# Patient Record
Sex: Female | Born: 1995 | Race: Black or African American | Hispanic: No | Marital: Married | Smoking: Never smoker
Health system: Southern US, Community
[De-identification: ages and names within clinical notes are randomized; demographics above are authoritative.]

## PROBLEM LIST (undated history)

## (undated) DIAGNOSIS — U071 COVID-19: Secondary | ICD-10-CM

## (undated) DIAGNOSIS — R519 Headache, unspecified: Secondary | ICD-10-CM

## (undated) DIAGNOSIS — B009 Herpesviral infection, unspecified: Secondary | ICD-10-CM

## (undated) HISTORY — DX: Herpesviral infection, unspecified: B00.9

## (undated) HISTORY — DX: COVID-19: U07.1

## (undated) HISTORY — DX: Morbid (severe) obesity due to excess calories: E66.01

## (undated) HISTORY — DX: Headache, unspecified: R51.9

---
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
## 2018-11-06 ENCOUNTER — Telehealth: Payer: Self-pay

## 2018-11-06 ENCOUNTER — Ambulatory Visit: Payer: PRIVATE HEALTH INSURANCE | Admitting: Family Medicine

## 2018-11-06 NOTE — Telephone Encounter (Signed)
Questions for Screening COVID-19  Symptom onset:n/a  Travel or Contacts: no  During this illness, did/does the patient experience any of the following symptoms? Fever >100.4F []  Yes [x]  No []  Unknown Subjective fever (felt feverish) []  Yes [x]  No []  Unknown Chills []  Yes [x]  No []  Unknown Muscle aches (myalgia) []  Yes [x]  No []  Unknown Runny nose (rhinorrhea) []  Yes [x]  No []  Unknown Sore throat []  Yes [x]  No []  Unknown Cough (new onset or worsening of chronic cough) []  Yes [x]  No []  Unknown Shortness of breath (dyspnea) []  Yes [x]  No []  Unknown Nausea or vomiting []  Yes [x]  No []  Unknown Headache []  Yes [x]  No []  Unknown Abdominal pain  []  Yes [x]  No []  Unknown Diarrhea (?3 loose/looser than normal stools/24hr period) []  Yes [x]  No []  Unknown Other, specify:  Patient risk factors: Smoker? []  Current []  Former []  Never If female, currently pregnant? []  Yes []  No  There are no active problems to display for this patient.   Plan:  []  High risk for COVID-19 with red flags go to ED (with CP, SOB, weak/lightheaded, or fever > 101.5). Call ahead.  []  High risk for COVID-19 but stable. Inform provider and coordinate time for WEBEX visit.   []  No red flags but URI signs or symptoms okay for WEBEX visit.  

## 2018-11-07 ENCOUNTER — Ambulatory Visit: Payer: PRIVATE HEALTH INSURANCE | Admitting: Family Medicine

## 2018-11-14 ENCOUNTER — Ambulatory Visit (INDEPENDENT_AMBULATORY_CARE_PROVIDER_SITE_OTHER): Payer: PRIVATE HEALTH INSURANCE | Admitting: Family Medicine

## 2018-11-14 ENCOUNTER — Encounter: Payer: Self-pay | Admitting: Family Medicine

## 2018-11-14 VITALS — BP 124/84 | HR 101 | Temp 98.3°F | Ht 66.0 in | Wt 361.0 lb

## 2018-11-14 DIAGNOSIS — Z2821 Immunization not carried out because of patient refusal: Secondary | ICD-10-CM | POA: Diagnosis not present

## 2018-11-14 DIAGNOSIS — Z0000 Encounter for general adult medical examination without abnormal findings: Secondary | ICD-10-CM | POA: Diagnosis not present

## 2018-11-14 NOTE — Patient Instructions (Signed)
Preventive Care for [place], Female The transition to life after high school as a young adult can be a stressful time with many changes. You may start seeing a primary care physician instead of a pediatrician. This is the time when your health care becomes your responsibility. Preventive care refers to lifestyle choices and visits with your health care provider that can promote health and wellness. What does preventive care include?  A yearly physical exam. This is also called an annual wellness visit.  Dental exams once or twice a year.  Routine eye exams. Ask your health care provider how often you should have your eyes checked.  Personal lifestyle choices, including: ? Daily care of your teeth and gums. ? Regular physical activity. ? Eating a healthy diet. ? Avoiding tobacco and drug use. ? Avoiding or limiting alcohol use. ? Practicing safe sex. ? Taking vitamin and mineral supplements as recommended by your health care provider. What happens during an annual wellness visit? Preventive care starts with a yearly visit to your primary care physician. The services and screenings done by your health care provider during your annual wellness visit will depend on your overall health, lifestyle risk factors, and family history of disease. Counseling Your health care provider may ask you questions about:  Past medical problems and your family's medical history.  Medicines or supplements you take.  Health insurance and access to health care.  Alcohol, tobacco, and drug use.  Your safety at home, work, or school.  Access to firearms.  Emotional well-being and how you cope with stress.  Relationship well-being.  Diet, exercise, and sleep habits.  Your sexual health and activity.  Your methods of birth control.  Your menstrual cycle.  Your pregnancy history. Screening You may have the following tests or measurements:  Height, weight, and BMI.  Blood pressure.   Lipid and cholesterol levels.  Tuberculosis skin test.  Skin exam.  Vision and hearing tests.  Screening test for hepatitis.  Screening tests for sexually transmitted diseases (STDs), if you are at risk.  BRCA-related cancer screening. This may be done if you have a family history of breast, ovarian, tubal, or peritoneal cancers.  Pelvic exam and Pap test. This may be done every 3 years starting at age 2. Vaccines Your health care provider may recommend certain vaccines, such as:  Influenza vaccine. This is recommended every year.  Tetanus, diphtheria, and acellular pertussis (Tdap, Td) vaccine. You may need a Td booster every 10 years.  Varicella vaccine. You may need this if you have not been vaccinated.  HPV vaccine. If you are 69 or younger, you may need three doses over 6 months.  Measles, mumps, and rubella (MMR) vaccine. You may need at least one dose of MMR. You may also need a second dose.  Pneumococcal 13-valent conjugate (PCV13) vaccine. You may need this if you have certain conditions and were not previously vaccinated.  Pneumococcal polysaccharide (PPSV23) vaccine. You may need one or two doses if you smoke cigarettes or if you have certain conditions.  Meningococcal vaccine. One dose is recommended if you are age 29-21 years and a first-year college student living in a residence hall, or if you have one of several medical conditions. You may also need additional booster doses.  Hepatitis A vaccine. You may need this if you have certain conditions or if you travel or work in places where you may be exposed to hepatitis A.  Hepatitis B vaccine. You may need this if you have  certain conditions or if you travel or work in places where you may be exposed to hepatitis B.  Haemophilus influenzae type b (Hib) vaccine. You may need this if you have certain risk factors. Talk to your health care provider about which screenings and vaccines you need and how often you need  them. What steps can I take to develop healthy behaviors?      Have regular preventive health care visits with your primary care physician and dentist.  Eat a healthy diet.  Drink enough fluid to keep your urine pale yellow.  Stay active. Exercise at least 30 minutes 5 or more days of the week.  Use alcohol responsibly.  Maintain a healthy weight.  Do not use any products that contain nicotine, such as cigarettes, chewing tobacco, and e-cigarettes. If you need help quitting, ask your health care provider.  Do not use drugs.  Practice safe sex.  Use birth control (contraception) to prevent unwanted pregnancy. If you plan to become pregnant, see your health care provider for a pre-conception visit.  Find healthy ways to manage stress. How can I protect myself from injury? Injuries from violence or accidents are the leading cause of death among young adults and can often be prevented. Take these steps to help protect yourself:  Always wear your seat belt while driving or riding in a vehicle.  Do not drive if you have been drinking alcohol. Do not ride with someone who has been drinking.  Do not drive when you are tired or distracted. Do not text while driving.  Wear a helmet and other protective equipment during sports activities.  If you have firearms in your house, make sure you follow all gun safety procedures.  Seek help if you have been bullied, physically abused, or sexually abused.  Use the Internet responsibly to avoid dangers such as online bullying and online sexual predators. What can I do to cope with stress? Young adults may face many new challenges that can be stressful, such as finding a job, going to college, moving away from home, managing money, being in a relationship, getting married, and having children. To manage stress:  Avoid known stressful situations when you can.  Exercise regularly.  Find a stress-reducing activity that works best for you.  Examples include meditation, yoga, listening to music, or reading.  Spend time in nature.  Keep a journal to write about your stress and how you respond.  Talk to your health care provider about stress. He or she may suggest counseling.  Spend time with supportive friends or family.  Do not cope with stress by: ? Drinking alcohol or using drugs. ? Smoking cigarettes. ? Eating. Where can I get more information? Learn more about preventive care and healthy habits from:  [place] and Gynecologists: KaraokeExchange.nl  U.S. Probation officer Task Force: StageSync.si  National Adolescent and [place]: StrategicRoad.nl  American Academy of Pediatrics Bright Futures: https://brightfutures.MemberVerification.co.za  Society for Adolescent Health and Medicine: MoralBlog.co.za.aspx  PodExchange.nl: ToyLending.fr This information is not intended to replace advice given to you by your health care provider. Make sure you discuss any questions you have with your health care provider. Document Released: 09/22/2015 Document Revised: 12/18/2016 Document Reviewed: 09/22/2015 Elsevier Interactive Patient Education  2019 [employer].

## 2018-11-14 NOTE — Progress Notes (Signed)
Casey Rose is a 23 y.o. female  Chief Complaint  Patient presents with  . Establish Care    est care/CPE/ denies TDAP    HPI: Casey Rose is a 23 y.o. female here to establish care with our office and for CPE. She moved 10/20/18 to [place] [facility]. She has a boyfriend and they plan to get married this fall. She is currently looking for work.  She last saw previous PCP in 07/2018 - has labs at that time.  She has no issues or concerns today. She is overdue for vision exam and needs new Rx for glasses. She is due for dental exam.  Past Medical History:  Diagnosis Date  . HSV-1 infection     History reviewed. No pertinent surgical history.  Social History   Socioeconomic History  . Marital status: Single    Spouse name: Not on file  . Number of children: Not on file  . Years of education: Not on file  . Highest education level: Not on file  Occupational History  . Not on file  Social Needs  . Financial resource strain: Not on file  . Food insecurity    Worry: Not on file    Inability: Not on file  . Transportation needs    Medical: Not on file    Non-medical: Not on file  Tobacco Use  . Smoking status: Never Smoker  . Smokeless tobacco: Never Used  Substance and Sexual Activity  . Alcohol use: Never    Frequency: Never  . Drug use: Never  . Sexual activity: Not on file  Lifestyle  . Physical activity    Days per week: Not on file    Minutes per session: Not on file  . Stress: Not on file  Relationships  . Social Musicianconnections    Talks on phone: Not on file    Gets together: Not on file    Attends religious service: Not on file    Active member of club or organization: Not on file    Attends meetings of clubs or organizations: Not on file    Relationship status: Not on file  . Intimate partner violence    Fear of current or ex partner: Not on file    Emotionally abused: Not on file    Physically abused: Not on file    Forced sexual activity: Not on file   Other Topics Concern  . Not on file  Social History Narrative  . Not on file    Family History  Problem Relation Age of Onset  . Diabetes Mother   . Diabetes Maternal Aunt   . Diabetes Maternal Grandmother   . Diabetes Maternal Grandfather      There is no immunization history for the selected administration types on file for this patient.  No outpatient encounter medications on file as of 11/14/2018.   No facility-administered encounter medications on file as of 11/14/2018.      ROS: Gen: no fever, chills  Skin: no rash, itching ENT: no ear pain, ear drainage, nasal congestion, rhinorrhea, sinus pressure, sore throat Eyes: no blurry vision, double vision Resp: no cough, wheeze,SOB Breast: no breast tenderness, no nipple discharge, no breast masses CV: no CP, palpitations, LE edema,  GI: no heartburn, n/v/d/c, abd pain GU: no dysuria, urgency, frequency, hematuria; no vaginal itching, odor, discharge MSK: no joint pain, myalgias, back pain Neuro: no dizziness, headache, weakness, vertigo Psych: no depression, anxiety, insomnia   No Known Allergies  BP  124/84   Pulse (!) 101   Temp 98.3 F (36.8 C) (Oral)   Ht 5\' 6"  (1.676 m)   Wt (!) 361 lb (163.7 kg)   SpO2 99%   BMI 58.27 kg/m   Physical Exam  Constitutional: She is oriented to person, place, and time. She appears well-developed and well-nourished. No distress.  HENT:  Head: Normocephalic and atraumatic.  Right Ear: Tympanic membrane and ear canal normal.  Left Ear: Tympanic membrane and ear canal normal.  Nose: Nose normal.  Mouth/Throat: Oropharynx is clear and moist and mucous membranes are normal.  Eyes: Pupils are equal, round, and reactive to light. Conjunctivae are normal.  Neck: Neck supple. No thyromegaly present.  Cardiovascular: Normal rate, regular rhythm, normal heart sounds and intact distal pulses.  No murmur heard. Pulmonary/Chest: Effort normal and breath sounds normal. No respiratory  distress. She has no wheezes. She has no rhonchi.  Abdominal: Soft. Bowel sounds are normal. She exhibits no distension and no mass. There is no abdominal tenderness.  Musculoskeletal:        General: No edema.  Lymphadenopathy:    She has no cervical adenopathy.  Neurological: She is alert and oriented to person, place, and time. She exhibits normal muscle tone. Coordination normal.  Skin: Skin is warm and dry.  Psychiatric: She has a normal mood and affect. Her behavior is normal.  morbidly obese   A/P:  1. Annual physical exam - labs done in 07/2018 with previous PCP in [facility] - pt will request records - pt declines Tdap and states she believes immunizations UTD - pt due for dental and vision exams - pt is not conscious of her diet and does not exercise. She declines nutrition referral or referral to weight management - next CPE in 1 year  2. Tetanus, diphtheria, and acellular pertussis (Tdap) vaccination declined

## 2018-11-27 ENCOUNTER — Telehealth: Payer: Self-pay | Admitting: Family Medicine

## 2018-11-27 NOTE — Telephone Encounter (Signed)
Dr. Loletha Grayer okay to schedule TB skin test?

## 2018-11-27 NOTE — Telephone Encounter (Signed)
Yes ok to schedule visit for PPD placement

## 2018-11-27 NOTE — Telephone Encounter (Signed)
Patient called office requesting a TB skin test for a new job. Please contact patient if it is ok to schedule a nurse visit for TB skin test. There is no documentation giving ok for TB test. Please call patient.

## 2018-11-27 NOTE — Telephone Encounter (Signed)
Nurse visit made 

## 2018-12-01 ENCOUNTER — Telehealth: Payer: Self-pay | Admitting: Behavioral Health

## 2018-12-01 NOTE — Telephone Encounter (Signed)

## 2018-12-02 ENCOUNTER — Ambulatory Visit (INDEPENDENT_AMBULATORY_CARE_PROVIDER_SITE_OTHER): Payer: PRIVATE HEALTH INSURANCE

## 2018-12-02 DIAGNOSIS — Z111 Encounter for screening for respiratory tuberculosis: Secondary | ICD-10-CM

## 2018-12-02 NOTE — Progress Notes (Signed)
After obtaining consent, and per orders of Dr. Bryan Lemma, injection of Tubersol 0.20mL given in left forearm by Shawntell Dixson Berneta Sages. Patient instructed to remain in clinic for 20 minutes afterwards, and to report any adverse reaction to me immediately. She will return in 48 hours for this to be read/thx dmf

## 2018-12-03 NOTE — Progress Notes (Signed)
Agree with plan and treatment as documented below

## 2018-12-04 ENCOUNTER — Ambulatory Visit: Payer: PRIVATE HEALTH INSURANCE

## 2018-12-04 ENCOUNTER — Telehealth: Payer: Self-pay | Admitting: Family Medicine

## 2018-12-04 LAB — TB SKIN TEST: TB Skin Test: NEGATIVE

## 2018-12-04 NOTE — Telephone Encounter (Signed)

## 2019-09-23 ENCOUNTER — Other Ambulatory Visit: Payer: Self-pay

## 2019-09-24 ENCOUNTER — Telehealth (INDEPENDENT_AMBULATORY_CARE_PROVIDER_SITE_OTHER): Payer: Self-pay | Admitting: Family Medicine

## 2019-09-24 ENCOUNTER — Encounter: Payer: Self-pay | Admitting: Family Medicine

## 2019-09-24 ENCOUNTER — Telehealth: Payer: Self-pay

## 2019-09-24 VITALS — Temp 97.8°F | Ht 66.0 in

## 2019-09-24 DIAGNOSIS — R1030 Lower abdominal pain, unspecified: Secondary | ICD-10-CM

## 2019-09-24 DIAGNOSIS — N926 Irregular menstruation, unspecified: Secondary | ICD-10-CM

## 2019-09-24 NOTE — Telephone Encounter (Signed)
Called pt to schedule a lab appointment.  I LDM for her to call office to get scheduled.

## 2019-09-24 NOTE — Addendum Note (Signed)
Addended by: Varney Biles on: 09/24/2019 04:24 PM   Modules accepted: Orders

## 2019-09-24 NOTE — Progress Notes (Signed)
Virtual Visit via Video Note  Interactive audio and video telecommunications were attempted between myself and the patient, however failed, due to the patient having technical difficulties. We continued and completed the visit with audio only.   I connected with Casey Rose on 09/24/19 at  3:30 PM EDT by a video enabled telemedicine application and verified that I am speaking with the correct person using two identifiers. Location patient: home Location provider: work Persons participating in the virtual visit: patient, provider  I discussed the limitations of evaluation and management by telemedicine and the availability of in person appointments. The patient expressed understanding and agreed to proceed.  Chief Complaint  Patient presents with  . Abdominal Pain    Pt c/o having sharpe abd pain on and off since her last period in February.  Pt said that she has not had a period since 07/17/19 and that is not normal for her.  Pt has taken 2 pregnancy test and both came back negative.     HPI: Casey Rose is a 24 y.o. female who states she last period was in 06/2019 and it was normal length and flow.  She has had 4 episodes sharp pain in her lower abdomen (pt unable to further localize) since 06/2019 lasting less than a minute then resolves. No associated symptoms. Pain can be at rest (watching TV) or when she is standing HPT negative x 2 - last one on 09/19/19.   370ish lbs last week.  Wt Readings from Last 3 Encounters:  11/14/18 (!) 361 lb (163.7 kg)  No new or increased stress.   No GI issues - no constipation, diarrhea; appetite is good but less than normal; no reflux   Past Medical History:  Diagnosis Date  . HSV-1 infection     History reviewed. No pertinent surgical history.  Family History  Problem Relation Age of Onset  . Diabetes Mother   . Diabetes Maternal Aunt   . Diabetes Maternal Grandmother   . Diabetes Maternal Grandfather     Social History    Tobacco Use  . Smoking status: Never Smoker  . Smokeless tobacco: Never Used  Substance Use Topics  . Alcohol use: Never  . Drug use: Never    No current outpatient medications on file.  No Known Allergies    ROS: See pertinent positives and negatives per HPI.   EXAM:  VITALS per patient if applicable: Temp 97.8 F (36.6 C) (Temporal)   Ht 5\' 6"  (1.676 m)   LMP 07/17/2019   BMI 58.27 kg/m    GENERAL: alert, oriented, in no acute distress   LUNGS: no obvious SOB, gasping or wheezing, no conversational dyspnea  PSYCH/NEURO: pleasant and cooperative,  speech and thought processing grossly intact   ASSESSMENT AND PLAN: 1. Irregular periods 2. 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 Lower abdominal pain - 2 neg urine pregnancy tests, most recent 5 days ago - 07/19/2019 Pelvic Complete With Transvaginal; Future - Hemoglobin A1c - Testosterone - TSH   I discussed the assessment and treatment plan with the patient. The patient was provided an opportunity to ask questions and all were answered. The patient agreed with the plan and demonstrated an understanding of the instructions.   The patient was advised to call back or seek an in-person evaluation if the symptoms worsen or if the condition fails to improve as anticipated.   I spent [place] on the telephone appt with this patient.  , DO

## 2019-09-28 ENCOUNTER — Other Ambulatory Visit: Payer: Self-pay

## 2019-09-29 ENCOUNTER — Other Ambulatory Visit (INDEPENDENT_AMBULATORY_CARE_PROVIDER_SITE_OTHER): Payer: Self-pay

## 2019-09-29 DIAGNOSIS — N926 Irregular menstruation, unspecified: Secondary | ICD-10-CM

## 2019-09-30 LAB — TSH: TSH: 1.91 u[IU]/mL (ref 0.35–4.50)

## 2019-09-30 LAB — HEMOGLOBIN A1C: Hgb A1c MFr Bld: 5.7 % (ref 4.6–6.5)

## 2019-09-30 LAB — TESTOSTERONE: Testosterone: 45.64 ng/dL — ABNORMAL HIGH (ref 15.00–40.00)

## 2019-10-01 ENCOUNTER — Encounter: Payer: Self-pay | Admitting: Family Medicine

## 2019-10-07 ENCOUNTER — Other Ambulatory Visit: Payer: Self-pay

## 2019-10-23 ENCOUNTER — Ambulatory Visit
Admission: RE | Admit: 2019-10-23 | Discharge: 2019-10-23 | Disposition: A | Discharge status: Home / Self Care | Payer: 59 | Source: Ambulatory Visit | Attending: Family Medicine | Admitting: Family Medicine

## 2019-10-23 DIAGNOSIS — N926 Irregular menstruation, unspecified: Secondary | ICD-10-CM

## 2019-10-26 ENCOUNTER — Telehealth: Payer: Self-pay | Admitting: Family Medicine

## 2019-10-26 NOTE — Telephone Encounter (Signed)
Patient is calling to see if ultra sound results were back. CB is [phone]

## 2019-10-27 NOTE — Telephone Encounter (Signed)
Informed pt that Dr. Salena Saner will be in office tomorrow to result U/S and let pt know.

## 2019-10-28 NOTE — Telephone Encounter (Signed)
Notified patient of US results. Patient verbalized understanding.  

## 2019-10-28 NOTE — Telephone Encounter (Signed)
Pelvic US was normal other than a small amount of free fluid which could represent a recent ruptured cyst. This is benign and will be resorbed by the body. If she is still having this ongoing pain, would recommend she see OB-GYN

## 2020-04-21 LAB — OB RESULTS CONSOLE HEPATITIS B SURFACE ANTIGEN: Hepatitis B Surface Ag: NEGATIVE

## 2020-04-21 LAB — OB RESULTS CONSOLE RUBELLA ANTIBODY, IGM: Rubella: IMMUNE

## 2020-04-21 LAB — OB RESULTS CONSOLE GC/CHLAMYDIA
Chlamydia: NEGATIVE
Gonorrhea: NEGATIVE

## 2020-04-21 LAB — OB RESULTS CONSOLE HIV ANTIBODY (ROUTINE TESTING): HIV: NONREACTIVE

## 2020-04-28 ENCOUNTER — Other Ambulatory Visit: Payer: BC Managed Care – PPO

## 2020-04-28 DIAGNOSIS — Z20822 Contact with and (suspected) exposure to covid-19: Secondary | ICD-10-CM

## 2020-04-29 ENCOUNTER — Telehealth: Payer: Self-pay

## 2020-04-29 LAB — NOVEL CORONAVIRUS, NAA: SARS-CoV-2, NAA: DETECTED — AB

## 2020-04-29 LAB — SARS-COV-2, NAA 2 DAY TAT

## 2020-04-29 NOTE — Telephone Encounter (Signed)
Pt. Calling to verify positive COVID 19 results. Verbalizes understanding.

## 2020-04-30 ENCOUNTER — Encounter: Payer: Self-pay | Admitting: Oncology

## 2020-04-30 ENCOUNTER — Telehealth: Payer: Self-pay | Admitting: Oncology

## 2020-04-30 NOTE — Telephone Encounter (Signed)
Re: Mab Infusion  Called to Discuss with patient about Covid symptoms and the use of regeneron, a monoclonal antibody infusion for those with mild to moderate Covid symptoms and at a high risk of hospitalization.     Pt is qualified for this infusion at the [facility] center due to co-morbid conditions and/or a member of an at-risk group.    Past Medical History:  Diagnosis Date  . HSV-1 infection     Specific risk condition-obesity    Unable to reach pt. Left VM and MCM.  Mignon Pine, AGNP-C [phone] (Infusion Center Hotline) ,

## 2020-05-05 ENCOUNTER — Other Ambulatory Visit: Payer: BC Managed Care – PPO

## 2020-05-05 DIAGNOSIS — Z20822 Contact with and (suspected) exposure to covid-19: Secondary | ICD-10-CM

## 2020-05-07 LAB — NOVEL CORONAVIRUS, NAA: SARS-CoV-2, NAA: NOT DETECTED

## 2020-05-07 LAB — SARS-COV-2, NAA 2 DAY TAT

## 2020-05-21 NOTE — L&D Delivery Note (Signed)
Patient was C/C/+1 and pushed for about 10 minutes with epidural when she began to have moderate to severe variables with each contraction.  Consented pt for VE delivery and VE applied 4 times with 2 popoffs. Last pull to head delivery.  Shoulder dystocia resolved with [place], [place] screw and suprapubic pressure; finally relieved with episiotomy and delivery of posterior arm within 45 seconds.  VD  female infant, Apgars 2,8, weight P.  Cord gas still pending.  Total pushing time about 20 minutes.  The patient had no extension lacerations to the second degree episiotomy midline perineal- repaired with 2-0 vicryl R. Fundus was firm. EBL was expected amount. Placenta was delivered intact. Vagina was clear.  Delayed cord clamping WAS NOT done as baby was handed directly to Neo. Baby after stimulation rescucitation was vigorous and doing skin to skin with mother.  Casey Rose

## 2020-06-07 ENCOUNTER — Other Ambulatory Visit: Payer: Self-pay | Admitting: Obstetrics & Gynecology

## 2020-06-07 DIAGNOSIS — Z6841 Body Mass Index (BMI) 40.0 and over, adult: Secondary | ICD-10-CM

## 2020-06-16 ENCOUNTER — Encounter: Payer: Self-pay | Admitting: *Deleted

## 2020-06-20 ENCOUNTER — Ambulatory Visit: Payer: BC Managed Care – PPO | Admitting: *Deleted

## 2020-06-20 ENCOUNTER — Ambulatory Visit: Payer: BC Managed Care – PPO | Attending: Obstetrics & Gynecology

## 2020-06-20 ENCOUNTER — Encounter: Payer: Self-pay | Admitting: *Deleted

## 2020-06-20 ENCOUNTER — Other Ambulatory Visit: Payer: Self-pay

## 2020-06-20 ENCOUNTER — Other Ambulatory Visit: Payer: Self-pay | Admitting: *Deleted

## 2020-06-20 DIAGNOSIS — O99212 Obesity complicating pregnancy, second trimester: Secondary | ICD-10-CM

## 2020-06-20 DIAGNOSIS — O99332 Smoking (tobacco) complicating pregnancy, second trimester: Secondary | ICD-10-CM

## 2020-06-20 DIAGNOSIS — Z8616 Personal history of COVID-19: Secondary | ICD-10-CM

## 2020-06-20 DIAGNOSIS — R638 Other symptoms and signs concerning food and fluid intake: Secondary | ICD-10-CM

## 2020-06-20 DIAGNOSIS — Z3A19 19 weeks gestation of pregnancy: Secondary | ICD-10-CM

## 2020-06-20 DIAGNOSIS — O360120 Maternal care for anti-D [Rh] antibodies, second trimester, not applicable or unspecified: Secondary | ICD-10-CM

## 2020-06-20 DIAGNOSIS — O2692 Pregnancy related conditions, unspecified, second trimester: Secondary | ICD-10-CM

## 2020-06-20 DIAGNOSIS — Z6841 Body Mass Index (BMI) 40.0 and over, adult: Secondary | ICD-10-CM | POA: Diagnosis present

## 2020-06-20 DIAGNOSIS — E669 Obesity, unspecified: Secondary | ICD-10-CM

## 2020-06-20 DIAGNOSIS — F17200 Nicotine dependence, unspecified, uncomplicated: Secondary | ICD-10-CM

## 2020-07-19 ENCOUNTER — Encounter: Payer: Self-pay | Admitting: *Deleted

## 2020-07-19 ENCOUNTER — Ambulatory Visit: Payer: BC Managed Care – PPO | Admitting: *Deleted

## 2020-07-19 ENCOUNTER — Other Ambulatory Visit: Payer: Self-pay

## 2020-07-19 ENCOUNTER — Ambulatory Visit: Payer: BC Managed Care – PPO | Attending: Obstetrics

## 2020-07-19 VITALS — BP 138/78 | HR 95

## 2020-07-19 DIAGNOSIS — Z6841 Body Mass Index (BMI) 40.0 and over, adult: Secondary | ICD-10-CM | POA: Diagnosis present

## 2020-07-19 DIAGNOSIS — Z3A23 23 weeks gestation of pregnancy: Secondary | ICD-10-CM

## 2020-07-19 DIAGNOSIS — F17200 Nicotine dependence, unspecified, uncomplicated: Secondary | ICD-10-CM

## 2020-07-19 DIAGNOSIS — R638 Other symptoms and signs concerning food and fluid intake: Secondary | ICD-10-CM | POA: Insufficient documentation

## 2020-07-19 DIAGNOSIS — E669 Obesity, unspecified: Secondary | ICD-10-CM

## 2020-07-19 DIAGNOSIS — O98512 Other viral diseases complicating pregnancy, second trimester: Secondary | ICD-10-CM | POA: Diagnosis not present

## 2020-07-19 DIAGNOSIS — Z8616 Personal history of COVID-19: Secondary | ICD-10-CM

## 2020-07-19 DIAGNOSIS — O99332 Smoking (tobacco) complicating pregnancy, second trimester: Secondary | ICD-10-CM

## 2020-07-19 DIAGNOSIS — O99212 Obesity complicating pregnancy, second trimester: Secondary | ICD-10-CM

## 2020-07-19 DIAGNOSIS — O360120 Maternal care for anti-D [Rh] antibodies, second trimester, not applicable or unspecified: Secondary | ICD-10-CM

## 2020-10-20 LAB — OB RESULTS CONSOLE GBS: GBS: POSITIVE

## 2020-10-26 ENCOUNTER — Inpatient Hospital Stay (HOSPITAL_BASED_OUTPATIENT_CLINIC_OR_DEPARTMENT_OTHER): Payer: BC Managed Care – PPO

## 2020-10-26 ENCOUNTER — Encounter (HOSPITAL_COMMUNITY): Payer: Self-pay | Admitting: Obstetrics and Gynecology

## 2020-10-26 ENCOUNTER — Other Ambulatory Visit: Payer: Self-pay

## 2020-10-26 ENCOUNTER — Inpatient Hospital Stay (EMERGENCY_DEPARTMENT_HOSPITAL)
Admission: AD | Admit: 2020-10-26 | Discharge: 2020-10-26 | Disposition: A | Discharge status: Home / Self Care | Payer: BC Managed Care – PPO | Source: Home / Self Care | Attending: Obstetrics and Gynecology | Admitting: Obstetrics and Gynecology

## 2020-10-26 DIAGNOSIS — Z8616 Personal history of COVID-19: Secondary | ICD-10-CM | POA: Insufficient documentation

## 2020-10-26 DIAGNOSIS — O26893 Other specified pregnancy related conditions, third trimester: Secondary | ICD-10-CM

## 2020-10-26 DIAGNOSIS — Z3A37 37 weeks gestation of pregnancy: Secondary | ICD-10-CM

## 2020-10-26 DIAGNOSIS — O368330 Maternal care for abnormalities of the fetal heart rate or rhythm, third trimester, not applicable or unspecified: Secondary | ICD-10-CM | POA: Insufficient documentation

## 2020-10-26 DIAGNOSIS — O288 Other abnormal findings on antenatal screening of mother: Secondary | ICD-10-CM

## 2020-10-26 DIAGNOSIS — Z3689 Encounter for other specified antenatal screening: Secondary | ICD-10-CM

## 2020-10-26 DIAGNOSIS — O99213 Obesity complicating pregnancy, third trimester: Secondary | ICD-10-CM | POA: Insufficient documentation

## 2020-10-26 DIAGNOSIS — R030 Elevated blood-pressure reading, without diagnosis of hypertension: Secondary | ICD-10-CM

## 2020-10-26 LAB — CBC
HCT: 37.9 % (ref 36.0–46.0)
Hemoglobin: 12.7 g/dL (ref 12.0–15.0)
MCH: 30.5 pg (ref 26.0–34.0)
MCHC: 33.5 g/dL (ref 30.0–36.0)
MCV: 90.9 fL (ref 80.0–100.0)
Platelets: 195 10*3/uL (ref 150–400)
RBC: 4.17 MIL/uL (ref 3.87–5.11)
RDW: 14.7 % (ref 11.5–15.5)
WBC: 10.1 10*3/uL (ref 4.0–10.5)
nRBC: 0 % (ref 0.0–0.2)

## 2020-10-26 LAB — URINALYSIS, ROUTINE W REFLEX MICROSCOPIC
Bilirubin Urine: NEGATIVE
Glucose, UA: NEGATIVE mg/dL
Hgb urine dipstick: NEGATIVE
Ketones, ur: NEGATIVE mg/dL
Leukocytes,Ua: NEGATIVE
Nitrite: NEGATIVE
Protein, ur: 30 mg/dL — AB
Specific Gravity, Urine: 1.027 (ref 1.005–1.030)
pH: 6 (ref 5.0–8.0)

## 2020-10-26 LAB — COMPREHENSIVE METABOLIC PANEL
ALT: 18 U/L (ref 0–44)
AST: 23 U/L (ref 15–41)
Albumin: 2.5 g/dL — ABNORMAL LOW (ref 3.5–5.0)
Alkaline Phosphatase: 105 U/L (ref 38–126)
Anion gap: 9 (ref 5–15)
BUN: 7 mg/dL (ref 6–20)
CO2: 21 mmol/L — ABNORMAL LOW (ref 22–32)
Calcium: 9.1 mg/dL (ref 8.9–10.3)
Chloride: 106 mmol/L (ref 98–111)
Creatinine, Ser: 0.76 mg/dL (ref 0.44–1.00)
GFR, Estimated: >60 mL/min (ref >60)
Glucose, Bld: 95 mg/dL (ref 70–99)
Potassium: 4 mmol/L (ref 3.5–5.1)
Sodium: 136 mmol/L (ref 135–145)
Total Bilirubin: 0.3 mg/dL (ref 0.3–1.2)
Total Protein: 5.9 g/dL — ABNORMAL LOW (ref 6.5–8.1)

## 2020-10-26 LAB — PROTEIN / CREATININE RATIO, URINE
Creatinine, Urine: 309.08 mg/dL
Protein Creatinine Ratio: 0.08 mg/mg{Cre} (ref 0.00–0.15)
Total Protein, Urine: 26 mg/dL

## 2020-10-26 NOTE — Discharge Instructions (Signed)
Fetal Movement Counts Patient Name: ________________________________________________ Patient Due Date: ____________________  What is a fetal movement count? A fetal movement count is the number of times that you feel your baby move during a certain amount of time. This may also be called a fetal kick count. A fetal movement count is recommended for every pregnant woman. You may be asked to start counting fetal movements as early as week 28 of your pregnancy. Pay attention to when your baby is most active. You may notice your baby's sleep and wake cycles. You may also notice things that make your baby move more. You should do a fetal movement count:  When your baby is normally most active.  At the same time each day. A good time to count movements is while you are resting, after having something to eat and drink. How do I count fetal movements? 1. Find a quiet, comfortable area. Sit, or lie down on your side. 2. Write down the date, the start time and stop time, and the number of movements that you felt between those two times. Take this information with you to your health care visits. 3. Write down your start time when you feel the first movement. 4. Count kicks, flutters, swishes, rolls, and jabs. You should feel at least 10 movements. 5. You may stop counting after you have felt 10 movements, or if you have been counting for 2 hours. Write down the stop time. 6. If you do not feel 10 movements in 2 hours, contact your health care provider for further instructions. Your health care provider may want to do additional tests to assess your baby's well-being. Contact a health care provider if:  You feel fewer than 10 movements in 2 hours.  Your baby is not moving like he or she usually does. Date: ____________ Start time: ____________ Stop time: ____________ Movements: ____________ Date: ____________ Start time: ____________ Stop time: ____________ Movements: ____________ Date: ____________  Start time: ____________ Stop time: ____________ Movements: ____________ Date: ____________ Start time: ____________ Stop time: ____________ Movements: ____________ Date: ____________ Start time: ____________ Stop time: ____________ Movements: ____________ Date: ____________ Start time: ____________ Stop time: ____________ Movements: ____________ Date: ____________ Start time: ____________ Stop time: ____________ Movements: ____________ Date: ____________ Start time: ____________ Stop time: ____________ Movements: ____________ Date: ____________ Start time: ____________ Stop time: ____________ Movements: ____________ This information is not intended to replace advice given to you by your health care provider. Make sure you discuss any questions you have with your health care provider. Document Revised: 12/25/2018 Document Reviewed: 12/25/2018 Elsevier Patient Education  2021 Elsevier Inc. Hypertension During Pregnancy High blood pressure (hypertension) is when the force of blood pumping through the arteries is high enough to cause problems with your health. Arteries are blood vessels that carry blood from the heart throughout the body. Hypertension during pregnancy can cause problems for you and your baby. It can be mild or severe. There are different types of hypertension that can happen during pregnancy. These include:  Chronic hypertension. This happens when you had high blood pressure before you became pregnant, and it continues during the pregnancy. Hypertension that develops before you are [redacted] weeks pregnant and continues during the pregnancy is also called chronic hypertension. If you have chronic hypertension, it will not go away after you have your baby. You will need follow-up visits with your health care provider after you have your baby. Your health care provider may want you to keep taking medicine for your blood pressure.  Gestational hypertension. This   is hypertension that develops  after the 20th week of pregnancy. Gestational hypertension usually goes away after you have your baby, but your health care provider will need to monitor your blood pressure to make sure that it is getting better.  Postpartum hypertension. This is high blood pressure that was present before delivery and continues after delivery or that starts after delivery. This usually occurs within 48 hours after childbirth but may occur up to 6 weeks after giving birth. When hypertension during pregnancy is severe, it is a medical emergency that requires treatment right away. How does this affect me? Women who have hypertension during pregnancy have a greater chance of developing hypertension later in life or during future pregnancies. In some cases, hypertension during pregnancy can cause serious complications, such as:  Stroke.  Heart attack.  Injury to other organs, such as kidneys, lungs, or liver.  Preeclampsia.  A condition called hemolysis, elevated liver enzymes, and low platelet count (HELLP) syndrome.  Convulsions or seizures.  Placental abruption. How does this affect my baby? Hypertension during pregnancy can affect your baby. Your baby may:  Be born early (prematurely).  Not weigh as much as he or she should at birth (low birth weight).  Not tolerate labor well, leading to an unplanned cesarean delivery. This condition may also result in a baby's death before birth (stillbirth). What are the risks? There are certain factors that make it more likely for you to develop hypertension during pregnancy. These include:  Having hypertension during a previous pregnancy or a family history of hypertension.  Being overweight.  Being age 35 or older.  Being pregnant for the first time.  Being pregnant with more than one baby.  Becoming pregnant using fertilization methods, such as IVF (in vitro fertilization).  Having other medical problems, such as diabetes, kidney disease, or  lupus. What can I do to lower my risk? The exact cause of hypertension during pregnancy is not known. You may be able to lower your risk by:  Maintaining a healthy weight.  Eating a healthy and balanced diet.  Following your health care provider's instructions about treating any long-term conditions that you had before becoming pregnant. It is very important to keep all of your prenatal care appointments. Your health care provider will check your blood pressure and make sure that your pregnancy is progressing as expected. If a problem is found, early treatment can prevent complications.   How is this treated? Treatment for hypertension during pregnancy varies depending on the type of hypertension you have and how serious it is.  If you were taking medicine for high blood pressure before you became pregnant, talk with your health care provider. You may need to change medicine during pregnancy because some medicines, like ACE inhibitors, may not be considered safe for your baby.  If you have gestational hypertension, your health care provider may order medicine to treat this during pregnancy.  If you are at risk for preeclampsia, your health care provider may recommend that you take a low-dose aspirin during your pregnancy.  If you have severe hypertension, you may need to be hospitalized so you and your baby can be monitored closely. You may also need to be given medicine to lower your blood pressure.  In some cases, if your condition gets worse, you may need to deliver your baby early. Follow these instructions at home: Eating and drinking  Drink enough fluid to keep your urine pale yellow.  Avoid caffeine.   Lifestyle  Do not   use any products that contain nicotine or tobacco. These products include cigarettes, chewing tobacco, and vaping devices, such as e-cigarettes. If you need help quitting, ask your health care provider.  Do not use alcohol or drugs.  Avoid stress as much as  possible.  Rest and get plenty of sleep.  Regular exercise can help to reduce your blood pressure. Ask your health care provider what kinds of exercise are best for you. General instructions  Take over-the-counter and prescription medicines only as told by your health care provider.  Keep all prenatal and follow-up visits. This is important. Contact a health care provider if:  You have symptoms that your health care provider told you may require more treatment or monitoring, such as: ? Headaches. ? Nausea or vomiting. ? Abdominal pain. ? Dizziness. ? Light-headedness. Get help right away if:  You have symptoms of serious complications, such as: ? Severe abdominal pain that does not get better with treatment. ? A severe headache that does not get better, blurred vision, or double vision. ? Vomiting that does not get better. ? Sudden, rapid weight gain or swelling in your hands, ankles, or face. ? Vaginal bleeding. ? Blood in your urine. ? Shortness of breath or chest pain. ? Weakness on one side of your body or difficulty speaking.  Your baby is not moving as much as usual. These symptoms may represent a serious problem that is an emergency. Do not wait to see if the symptoms will go away. Get medical help right away. Call your local emergency services (911 in the [place] Do not drive yourself to the hospital. Summary  Hypertension during pregnancy can cause problems for you and your baby.  Treatment for hypertension during pregnancy varies depending on the type of hypertension you have and how serious it is.  Keep all prenatal and follow-up visits. This is important.  Get help right away if you have symptoms of serious complications related to high blood pressure. This information is not intended to replace advice given to you by your health care provider. Make sure you discuss any questions you have with your health care provider. Document Revised: 01/28/2020 Document  Reviewed: 01/28/2020 Elsevier Patient Education  2021 Elsevier Inc.  

## 2020-10-26 NOTE — MAU Note (Signed)
Casey Rose is a 25 y.o. at [redacted]w[redacted]d here in MAU reporting: sent over from the office for failed NST. Reporting +FM. Denies bleeding, LOF, or pain.  Onset of complaint: today  Pain score: 0/10  Vitals:   10/26/20 1148 10/26/20 1205  BP: (!) 148/89 (!) 144/93  Pulse: 91 83  Resp: 18   Temp:  98.1 F (36.7 C)  SpO2: 99%      FHT: EFM applied in room  Lab orders placed from triage: UA

## 2020-10-26 NOTE — MAU Provider Note (Signed)
History     CSN: [id number]  Arrival date and time: 10/26/20 1133   Event Date/Time   First Provider Initiated Contact with Patient 10/26/20 1232      Chief Complaint  Patient presents with  . Fetal monitoring   HPI Casey Rose is a 25 y.o. G1P0 at [redacted]w[redacted]d who presents from the office for a BPP. Was seen at [facility] ob by Dr. Henderson Cloud; had an NST which showed one acceleration. Sent here for a BPP. Denies history of hypertension. Denies headache, visual disturbance, epigastric pain, contractions, LOF, or vaginal bleeding. Reports good fetal movement.   OB History    Gravida  1   Para      Term      Preterm      AB      Living        SAB      IAB      Ectopic      Multiple      Live Births              Past Medical History:  Diagnosis Date  . COVID-19   . HSV-1 infection   . Morbid obesity (HCC)     Past Surgical History:  Procedure Laterality Date  . WISDOM TOOTH EXTRACTION  2017    Family History  Problem Relation Age of Onset  . Diabetes Mother   . Diabetes Maternal Aunt   . Diabetes Maternal Grandmother   . Diabetes Maternal Grandfather     Social History   Tobacco Use  . Smoking status: Never Smoker  . Smokeless tobacco: Never Used  Vaping Use  . Vaping Use: Never used  Substance Use Topics  . Alcohol use: Never  . Drug use: Never    Allergies: No Known Allergies  Medications Prior to Admission  Medication Sig Dispense Refill Last Dose  . aspirin EC 81 MG tablet Take 81 mg by mouth daily. Swallow whole.     . Prenatal Vit-Fe Fumarate-FA (PRENATAL MULTIVITAMIN) TABS tablet Take 1 tablet by mouth daily at 12 noon.     . valACYclovir (VALTREX) 500 MG tablet Take 500 mg by mouth 2 (two) times daily.       Review of Systems  Constitutional: Negative.   Eyes: Negative for visual disturbance.  Gastrointestinal: Negative.   Genitourinary: Negative.   Neurological: Negative for headaches.   Physical Exam   Blood pressure  138/90, pulse 93, temperature 98.1 F (36.7 C), temperature source Oral, resp. rate 18, last menstrual period 01/25/2020, SpO2 99 %. Patient Vitals for the past 24 hrs:  BP Temp Temp src Pulse Resp SpO2  10/26/20 1331 (!) 135/93 -- -- 84 -- --  10/26/20 1317 (!) 143/93 -- -- (!) 101 -- --  10/26/20 1246 (!) 143/84 -- -- 90 -- --  10/26/20 1231 137/84 -- -- 91 -- --  10/26/20 1216 138/90 -- -- 93 -- --  10/26/20 1205 (!) 144/93 98.1 F (36.7 C) Oral 83 -- --  10/26/20 1148 (!) 148/89 -- -- 91 18 99 %    Physical Exam Vitals and nursing note reviewed.  Constitutional:      General: She is not in acute distress.    Appearance: Normal appearance. She is obese.  HENT:     Head: Normocephalic and atraumatic.  Eyes:     General: No scleral icterus. Pulmonary:     Effort: Pulmonary effort is normal. No respiratory distress.  Abdominal:     Tenderness: There  is no abdominal tenderness.  Musculoskeletal:     Right lower leg: 2+ Pitting Edema present.     Left lower leg: 2+ Pitting Edema present.  Skin:    General: Skin is warm and dry.  Neurological:     Mental Status: She is alert.  Psychiatric:        Mood and Affect: Mood normal.        Behavior: Behavior normal.    NST:  Baseline: 145 bpm, Variability: Good {> 6 bpm), Accelerations: Reactive and Decelerations: Absent  MAU Course  Procedures Results for orders placed or performed during the hospital encounter of 10/26/20 (from the past 24 hour(s))  Urinalysis, Routine w reflex microscopic Urine, Clean Catch     Status: Abnormal   Collection Time: 10/26/20 12:07 PM  Result Value Ref Range   Color, Urine YELLOW YELLOW   APPearance HAZY (A) CLEAR   Specific Gravity, Urine 1.027 1.005 - 1.030   pH 6.0 5.0 - 8.0   Glucose, UA NEGATIVE NEGATIVE mg/dL   Hgb urine dipstick NEGATIVE NEGATIVE   Bilirubin Urine NEGATIVE NEGATIVE   Ketones, ur NEGATIVE NEGATIVE mg/dL   Protein, ur 30 (A) NEGATIVE mg/dL   Nitrite NEGATIVE NEGATIVE    Leukocytes,Ua NEGATIVE NEGATIVE   RBC / HPF 0-5 0 - 5 RBC/hpf   WBC, UA 0-5 0 - 5 WBC/hpf   Bacteria, UA FEW (A) NONE SEEN   Squamous Epithelial / LPF 6-10 0 - 5   Mucus PRESENT   Protein / creatinine ratio, urine     Status: None   Collection Time: 10/26/20 12:12 PM  Result Value Ref Range   Creatinine, Urine 309.08 mg/dL   Total Protein, Urine 26 mg/dL   Protein Creatinine Ratio 0.08 0.00 - 0.15 mg/mg[Cre]  CBC     Status: None   Collection Time: 10/26/20 12:23 PM  Result Value Ref Range   WBC 10.1 4.0 - 10.5 K/uL   RBC 4.17 3.87 - 5.11 MIL/uL   Hemoglobin 12.7 12.0 - 15.0 g/dL   HCT 88.4 16.6 - 06.3 %   MCV 90.9 80.0 - 100.0 fL   MCH 30.5 26.0 - 34.0 pg   MCHC 33.5 30.0 - 36.0 g/dL   RDW 01.6 01.0 - 93.2 %   Platelets 195 150 - 400 K/uL   nRBC 0.0 0.0 - 0.2 %  Comprehensive metabolic panel     Status: Abnormal   Collection Time: 10/26/20 12:23 PM  Result Value Ref Range   Sodium 136 135 - 145 mmol/L   Potassium 4.0 3.5 - 5.1 mmol/L   Chloride 106 98 - 111 mmol/L   CO2 21 (L) 22 - 32 mmol/L   Glucose, Bld 95 70 - 99 mg/dL   BUN 7 6 - 20 mg/dL   Creatinine, Ser 3.55 0.44 - 1.00 mg/dL   Calcium 9.1 8.9 - 73.2 mg/dL   Total Protein 5.9 (L) 6.5 - 8.1 g/dL   Albumin 2.5 (L) 3.5 - 5.0 g/dL   AST 23 15 - 41 U/L   ALT 18 0 - 44 U/L   Alkaline Phosphatase 105 38 - 126 U/L   Total Bilirubin 0.3 0.3 - 1.2 mg/dL   GFR, Estimated >20 >25 mL/min   Anion gap 9 5 - 15   No results found.  MDM Patient sent from office for non reactive tracing. Tracing is reactive in MAU & BPP is 8/8 & AFI is 13.8. Patient is hypertensive here. Denies history of hypertension. Difficult to find  appropriate cuff & placement due to body habitus - unclear if improper fit or truly hypertension. She is asymptomatic & preeclampsia labs are normal. BPs are not in severe range & not at least 4 hours apart so unable to make gestational hypertension at this time.   S/w Dr. Claiborne Billings regarding new onset  hypertension without diagnosis. Will get set up for BP check in the office tomorrow.   Assessment and Plan   1. NST (non-stress test) reactive on fetal surveillance   2. Non-stress test nonreactive   3. [redacted] weeks gestation of pregnancy   4. Elevated BP without diagnosis of hypertension    -reviewed reasons to return to MAU including s/s of preeclampsia -pt to call office tomorrow morning for BP appointment  Judeth Horn 10/26/2020, 12:32 PM

## 2020-10-27 ENCOUNTER — Encounter (HOSPITAL_COMMUNITY): Payer: Self-pay | Admitting: Obstetrics and Gynecology

## 2020-10-27 ENCOUNTER — Inpatient Hospital Stay (HOSPITAL_COMMUNITY)
Admission: AD | Admit: 2020-10-27 | Discharge: 2020-10-31 | DRG: 807 | Disposition: A | Discharge status: Home / Self Care | Payer: BC Managed Care – PPO | Attending: Obstetrics and Gynecology | Admitting: Obstetrics and Gynecology

## 2020-10-27 DIAGNOSIS — O360130 Maternal care for anti-D [Rh] antibodies, third trimester, not applicable or unspecified: Secondary | ICD-10-CM

## 2020-10-27 DIAGNOSIS — Z8616 Personal history of COVID-19: Secondary | ICD-10-CM

## 2020-10-27 DIAGNOSIS — O134 Gestational [pregnancy-induced] hypertension without significant proteinuria, complicating childbirth: Principal | ICD-10-CM | POA: Diagnosis present

## 2020-10-27 DIAGNOSIS — Z3A37 37 weeks gestation of pregnancy: Secondary | ICD-10-CM

## 2020-10-27 DIAGNOSIS — O288 Other abnormal findings on antenatal screening of mother: Secondary | ICD-10-CM

## 2020-10-27 DIAGNOSIS — O98813 Other maternal infectious and parasitic diseases complicating pregnancy, third trimester: Secondary | ICD-10-CM | POA: Diagnosis not present

## 2020-10-27 DIAGNOSIS — O99333 Smoking (tobacco) complicating pregnancy, third trimester: Secondary | ICD-10-CM

## 2020-10-27 DIAGNOSIS — Z3689 Encounter for other specified antenatal screening: Secondary | ICD-10-CM | POA: Diagnosis not present

## 2020-10-27 DIAGNOSIS — O99213 Obesity complicating pregnancy, third trimester: Secondary | ICD-10-CM

## 2020-10-27 DIAGNOSIS — O99824 Streptococcus B carrier state complicating childbirth: Secondary | ICD-10-CM | POA: Diagnosis present

## 2020-10-27 DIAGNOSIS — E669 Obesity, unspecified: Secondary | ICD-10-CM

## 2020-10-27 DIAGNOSIS — U071 COVID-19: Secondary | ICD-10-CM | POA: Diagnosis present

## 2020-10-27 DIAGNOSIS — O99214 Obesity complicating childbirth: Secondary | ICD-10-CM | POA: Diagnosis present

## 2020-10-27 DIAGNOSIS — Z23 Encounter for immunization: Secondary | ICD-10-CM | POA: Diagnosis not present

## 2020-10-27 DIAGNOSIS — F17220 Nicotine dependence, chewing tobacco, uncomplicated: Secondary | ICD-10-CM

## 2020-10-27 DIAGNOSIS — O26893 Other specified pregnancy related conditions, third trimester: Secondary | ICD-10-CM | POA: Diagnosis not present

## 2020-10-27 DIAGNOSIS — O139 Gestational [pregnancy-induced] hypertension without significant proteinuria, unspecified trimester: Secondary | ICD-10-CM | POA: Diagnosis present

## 2020-10-27 DIAGNOSIS — R030 Elevated blood-pressure reading, without diagnosis of hypertension: Secondary | ICD-10-CM | POA: Diagnosis not present

## 2020-10-27 DIAGNOSIS — O9081 Anemia of the puerperium: Secondary | ICD-10-CM | POA: Diagnosis not present

## 2020-10-27 LAB — CBC
HCT: 40.8 % (ref 36.0–46.0)
Hemoglobin: 13.2 g/dL (ref 12.0–15.0)
MCH: 29.9 pg (ref 26.0–34.0)
MCHC: 32.4 g/dL (ref 30.0–36.0)
MCV: 92.3 fL (ref 80.0–100.0)
Platelets: 217 10*3/uL (ref 150–400)
RBC: 4.42 MIL/uL (ref 3.87–5.11)
RDW: 14.6 % (ref 11.5–15.5)
WBC: 9.4 10*3/uL (ref 4.0–10.5)
nRBC: 0 % (ref 0.0–0.2)

## 2020-10-27 LAB — PROTEIN / CREATININE RATIO, URINE
Creatinine, Urine: 470.59 mg/dL
Protein Creatinine Ratio: 0.17 mg/mg{Cre} — ABNORMAL HIGH (ref 0.00–0.15)
Total Protein, Urine: 79 mg/dL

## 2020-10-27 LAB — COMPREHENSIVE METABOLIC PANEL
ALT: 23 U/L (ref 0–44)
AST: 35 U/L (ref 15–41)
Albumin: 2.7 g/dL — ABNORMAL LOW (ref 3.5–5.0)
Alkaline Phosphatase: 99 U/L (ref 38–126)
Anion gap: 11 (ref 5–15)
BUN: 7 mg/dL (ref 6–20)
CO2: 20 mmol/L — ABNORMAL LOW (ref 22–32)
Calcium: 9.4 mg/dL (ref 8.9–10.3)
Chloride: 105 mmol/L (ref 98–111)
Creatinine, Ser: 0.74 mg/dL (ref 0.44–1.00)
GFR, Estimated: >60 mL/min (ref >60)
Glucose, Bld: 91 mg/dL (ref 70–99)
Potassium: 4.2 mmol/L (ref 3.5–5.1)
Sodium: 136 mmol/L (ref 135–145)
Total Bilirubin: 0.8 mg/dL (ref 0.3–1.2)
Total Protein: 6.6 g/dL (ref 6.5–8.1)

## 2020-10-27 LAB — TYPE AND SCREEN
ABO/RH(D): O NEG
Antibody Screen: NEGATIVE

## 2020-10-27 LAB — RESP PANEL BY RT-PCR (FLU A&B, COVID) ARPGX2
Influenza A by PCR: NEGATIVE
Influenza B by PCR: NEGATIVE
SARS Coronavirus 2 by RT PCR: POSITIVE — AB

## 2020-10-27 MED ORDER — LIDOCAINE HCL (PF) 1 % IJ SOLN
30.0000 mL | INTRAMUSCULAR | Status: DC | PRN
  Administered 2020-10-29: 30 mL via SUBCUTANEOUS
  Filled 2020-10-27: qty 30

## 2020-10-27 MED ORDER — MISOPROSTOL 25 MCG QUARTER TABLET
25.0000 ug | ORAL_TABLET | ORAL | Status: DC | PRN
  Administered 2020-10-27 – 2020-10-28 (×3): 25 ug via VAGINAL
  Filled 2020-10-27 (×3): qty 1

## 2020-10-27 MED ORDER — PENICILLIN G POT IN DEXTROSE 60000 UNIT/ML IV SOLN
3.0000 10*6.[IU] | INTRAVENOUS | Status: DC
  Administered 2020-10-27 – 2020-10-28 (×7): 3 10*6.[IU] via INTRAVENOUS
  Filled 2020-10-27 (×7): qty 50

## 2020-10-27 MED ORDER — LACTATED RINGERS IV SOLN: INTRAVENOUS | Status: DC

## 2020-10-27 MED ORDER — LACTATED RINGERS IV SOLN: 500.0000 mL | INTRAVENOUS | Status: DC | PRN

## 2020-10-27 MED ORDER — OXYCODONE-ACETAMINOPHEN 5-325 MG PO TABS: 1.0000 | ORAL_TABLET | ORAL | Status: DC | PRN

## 2020-10-27 MED ORDER — SOD CITRATE-CITRIC ACID 500-334 MG/5ML PO SOLN: 30.0000 mL | ORAL | Status: DC | PRN

## 2020-10-27 MED ORDER — SODIUM CHLORIDE 0.9 % IV SOLN
5.0000 10*6.[IU] | Freq: Once | INTRAVENOUS | Status: AC
  Administered 2020-10-27: 5 10*6.[IU] via INTRAVENOUS
  Filled 2020-10-27: qty 5

## 2020-10-27 MED ORDER — OXYTOCIN BOLUS FROM INFUSION
333.0000 mL | Freq: Once | INTRAVENOUS | Status: AC
  Administered 2020-10-29: 600 mL/h via INTRAVENOUS

## 2020-10-27 MED ORDER — TERBUTALINE SULFATE 1 MG/ML IJ SOLN: 0.2500 mg | Freq: Once | INTRAMUSCULAR | Status: DC | PRN

## 2020-10-27 MED ORDER — ONDANSETRON HCL 4 MG/2ML IJ SOLN: 4.0000 mg | Freq: Four times a day (QID) | INTRAMUSCULAR | Status: DC | PRN

## 2020-10-27 MED ORDER — OXYCODONE-ACETAMINOPHEN 5-325 MG PO TABS
2.0000 | ORAL_TABLET | ORAL | Status: DC | PRN
Start: 2020-10-27 — End: 2020-10-29

## 2020-10-27 MED ORDER — ACETAMINOPHEN 325 MG PO TABS: 650.0000 mg | ORAL_TABLET | ORAL | Status: DC | PRN

## 2020-10-27 MED ORDER — OXYTOCIN-SODIUM CHLORIDE 30-0.9 UT/500ML-% IV SOLN: 2.5000 [IU]/h | INTRAVENOUS | Status: DC

## 2020-10-27 NOTE — H&P (Signed)
Casey Rose is a 25 y.o. female presenting for IOL for gestational hypertension  25 yo G1P0 @ 37+4 admitted for IOL for gestational hypertension. Pt was seen in the office for a PNV and was noted to have elevated BPs of 140s/90s. Her pregnancy has been complicated by morbid obesity. During a routine office visit on 10/26/20 she had a non-reactive NST. Pt was sent to MAU and had a BPP of 8/8. IN MAU pt had some elevated BPs and was sent back to office for BP check today. She now meets criteria for gestational hypertension OB History     Gravida  1   Para      Term      Preterm      AB      Living         SAB      IAB      Ectopic      Multiple      Live Births             Past Medical History:  Diagnosis Date   COVID-19    HSV-1 infection    Morbid obesity (HCC)    Past Surgical History:  Procedure Laterality Date   WISDOM TOOTH EXTRACTION  2017   Family History: family history includes Diabetes in her maternal aunt, maternal grandfather, maternal grandmother, and mother. Social History:  reports that she has never smoked. She has never used smokeless tobacco. She reports that she does not drink alcohol and does not use drugs.     Maternal Diabetes: No Genetic Screening: Normal Maternal Ultrasounds/Referrals: Normal Fetal Ultrasounds or other Referrals:  None Maternal Substance Abuse:  No Significant Maternal Medications:  None Significant Maternal Lab Results:  Group B Strep positive Other Comments:  None  Review of Systems History Dilation: 1.5 Effacement (%): Thick Station: -3 Exam by:: Conservation officer, nature Blood pressure (!) 155/94, pulse 92, temperature 98.2 F (36.8 C), temperature source Oral, resp. rate 18, height 5\' 6"  (1.676 m), weight (!) 178.1 kg, last menstrual period 01/25/2020. Exam Physical Exam  Prenatal labs: ABO, Rh: --/--/O NEG (06/09 1849) Antibody: NEG (06/09 1849) Rubella: Immune (12/02 0000) RPR:   Immune HBsAg: Negative (12/02  0000)  HIV: Non-reactive (12/02 0000)  GBS: Positive/-- (06/02 0000)   Assessment/Plan: 1) Admit 2) Cytotec Q 4 hrs PV 3) Epidural on request 4) Pt covid + 5) PCN for GBS    05-11-1979 10/27/2020, 11:02 PM

## 2020-10-28 ENCOUNTER — Inpatient Hospital Stay (HOSPITAL_COMMUNITY): Payer: BC Managed Care – PPO | Admitting: Anesthesiology

## 2020-10-28 ENCOUNTER — Encounter (HOSPITAL_COMMUNITY): Payer: Self-pay | Admitting: Obstetrics and Gynecology

## 2020-10-28 ENCOUNTER — Other Ambulatory Visit: Payer: Self-pay

## 2020-10-28 LAB — CBC
HCT: 38.4 % (ref 36.0–46.0)
Hemoglobin: 12.7 g/dL (ref 12.0–15.0)
MCH: 31 pg (ref 26.0–34.0)
MCHC: 33.1 g/dL (ref 30.0–36.0)
MCV: 93.7 fL (ref 80.0–100.0)
Platelets: 202 10*3/uL (ref 150–400)
RBC: 4.1 MIL/uL (ref 3.87–5.11)
RDW: 14.8 % (ref 11.5–15.5)
WBC: 11.1 10*3/uL — ABNORMAL HIGH (ref 4.0–10.5)
nRBC: 0 % (ref 0.0–0.2)

## 2020-10-28 LAB — RPR: RPR Ser Ql: NONREACTIVE

## 2020-10-28 MED ORDER — LABETALOL HCL 5 MG/ML IV SOLN: 20.0000 mg | INTRAVENOUS | Status: DC | PRN

## 2020-10-28 MED ORDER — EPHEDRINE 5 MG/ML INJ
10.0000 mg | INTRAVENOUS | Status: DC | PRN
  Administered 2020-10-28: 10 mg via INTRAVENOUS
  Filled 2020-10-28: qty 10

## 2020-10-28 MED ORDER — FENTANYL CITRATE (PF) 100 MCG/2ML IJ SOLN
100.0000 ug | INTRAMUSCULAR | Status: DC | PRN
  Administered 2020-10-28: 100 ug via INTRAVENOUS
  Filled 2020-10-28: qty 2

## 2020-10-28 MED ORDER — LABETALOL HCL 5 MG/ML IV SOLN: 80.0000 mg | INTRAVENOUS | Status: DC | PRN

## 2020-10-28 MED ORDER — LIDOCAINE HCL (PF) 1 % IJ SOLN
INTRAMUSCULAR | Status: DC | PRN
  Administered 2020-10-28: 10 mL via EPIDURAL

## 2020-10-28 MED ORDER — EPHEDRINE 5 MG/ML INJ: 10.0000 mg | INTRAVENOUS | Status: DC | PRN

## 2020-10-28 MED ORDER — LACTATED RINGERS AMNIOINFUSION: INTRAVENOUS | Status: DC

## 2020-10-28 MED ORDER — HYDRALAZINE HCL 20 MG/ML IJ SOLN: 10.0000 mg | INTRAMUSCULAR | Status: DC | PRN

## 2020-10-28 MED ORDER — PHENYLEPHRINE 40 MCG/ML (10ML) SYRINGE FOR IV PUSH (FOR BLOOD PRESSURE SUPPORT): 80.0000 ug | PREFILLED_SYRINGE | INTRAVENOUS | Status: DC | PRN

## 2020-10-28 MED ORDER — LACTATED RINGERS IV SOLN: 500.0000 mL | Freq: Once | INTRAVENOUS | Status: DC

## 2020-10-28 MED ORDER — OXYTOCIN-SODIUM CHLORIDE 30-0.9 UT/500ML-% IV SOLN: 1.0000 m[IU]/min | INTRAVENOUS | Status: DC

## 2020-10-28 MED ORDER — LABETALOL HCL 5 MG/ML IV SOLN: 40.0000 mg | INTRAVENOUS | Status: DC | PRN

## 2020-10-28 MED ORDER — DIPHENHYDRAMINE HCL 50 MG/ML IJ SOLN: 12.5000 mg | INTRAMUSCULAR | Status: DC | PRN

## 2020-10-28 MED ORDER — TERBUTALINE SULFATE 1 MG/ML IJ SOLN: 0.2500 mg | Freq: Once | INTRAMUSCULAR | Status: DC | PRN

## 2020-10-28 MED ORDER — OXYTOCIN-SODIUM CHLORIDE 30-0.9 UT/500ML-% IV SOLN
1.0000 m[IU]/min | INTRAVENOUS | Status: DC
Start: 2020-10-28 — End: 2020-10-28
  Administered 2020-10-28: 1 m[IU]/min via INTRAVENOUS
  Filled 2020-10-28: qty 500

## 2020-10-28 MED ORDER — FENTANYL-BUPIVACAINE-NACL 0.5-0.125-0.9 MG/250ML-% EP SOLN
12.0000 mL/h | EPIDURAL | Status: DC | PRN
  Administered 2020-10-28: 12 mL/h via EPIDURAL
  Filled 2020-10-28: qty 250

## 2020-10-28 NOTE — Progress Notes (Signed)
CTSP   Pt had multiple lates post epidural.  Pitocin turned off and now better.  Vitals:   10/28/20 1413 10/28/20 1431 10/28/20 1453 10/28/20 1455  BP: (!) 149/86 (!) 146/98 (!) 146/119 125/85  Pulse: 82 71 86 77  Resp: 20 18 20  (!) 22  Temp:      TempSrc:      SpO2:      Weight:      Height:        FHTs now 150s gSTV, gLTV and no decels- some 10x10s Toco irreg q 5=7 SVE 6/80/-2  FSE and IUPC placed.  Start pit at 2x2.

## 2020-10-28 NOTE — Anesthesia Procedure Notes (Signed)
Epidural Patient location during procedure: OB Start time: 10/28/2020 1:30 PM End time: 10/28/2020 1:35 PM  Staffing Anesthesiologist: Leilani Able, MD Performed: anesthesiologist   Preanesthetic Checklist Completed: patient identified, IV checked, site marked, risks and benefits discussed, surgical consent, monitors and equipment checked, pre-op evaluation and timeout performed  Epidural Patient position: sitting Prep: DuraPrep and site prepped and draped Patient monitoring: continuous pulse ox and blood pressure Approach: midline Location: L3-L4 Injection technique: LOR air  Needle:  Needle type: Tuohy  Needle gauge: 17 G Needle length: 9 cm and 9 Needle insertion depth: 9 cm Catheter type: closed end flexible Catheter size: 19 Gauge Catheter at skin depth: 15 cm Test dose: negative and Other  Assessment Events: blood not aspirated, injection not painful, no injection resistance, no paresthesia and negative IV test  Additional Notes Reason for block:procedure for pain

## 2020-10-28 NOTE — Anesthesia Preprocedure Evaluation (Signed)
Anesthesia Evaluation  Patient identified by MRN, date of birth, ID band Patient awake    Reviewed: Allergy & Precautions, NPO status , Patient's Chart, lab work & pertinent test results  Airway Mallampati: III       Dental no notable dental hx.    Pulmonary neg pulmonary ROS,    Pulmonary exam normal        Cardiovascular Normal cardiovascular exam     Neuro/Psych negative neurological ROS  negative psych ROS   GI/Hepatic negative GI ROS, Neg liver ROS,   Endo/Other  Morbid obesity  Renal/GU negative Renal ROS  negative genitourinary   Musculoskeletal negative musculoskeletal ROS (+)   Abdominal (+) + obese,   Peds  Hematology negative hematology ROS (+)   Anesthesia Other Findings   Reproductive/Obstetrics                             Anesthesia Physical Anesthesia Plan  ASA: 3  Anesthesia Plan: Epidural   Post-op Pain Management:    Induction:   PONV Risk Score and Plan:   Airway Management Planned:   Additional Equipment:   Intra-op Plan:   Post-operative Plan:   Informed Consent: I have reviewed the patients History and Physical, chart, labs and discussed the procedure including the risks, benefits and alternatives for the proposed anesthesia with the patient or authorized representative who has indicated his/her understanding and acceptance.       Plan Discussed with:   Anesthesia Plan Comments:         Anesthesia Quick Evaluation

## 2020-10-28 NOTE — Progress Notes (Signed)
Entered Room at [phone] after receiving handoff. Cardio US shows battery completely uncharged. ST asked to bring new one to room. Searching for FHR from 0727 until 0748. FHR audible  at times and point of maximal auscultation changing frequently. Able to find good location on maternal left side, near umbilicus using 2 belts and maternity panty.

## 2020-10-28 NOTE — Progress Notes (Signed)
Pt uncomfortable with lower vaginal pain.  Vitals:   10/28/20 2150 10/28/20 2200 10/28/20 2319 10/28/20 2348  BP:  (!) 144/65 (!) 144/77 117/78  Pulse:  (!) 118 96 (!) 138  Resp:      Temp: 98.4 F (36.9 C)     TempSrc: Oral     SpO2:      Weight:      Height:         FHTs 140s with NSTR but runs of mild to moderate variables.   Toco q 1-2 SVE 8/C/-2   A/P Continue pitocin again.  Pt in chair position.  REdose epidural if possible.  If has another severe range BP will do BP protocol and start Magnesium Sulfate.

## 2020-10-28 NOTE — Progress Notes (Signed)
Patient ID: Casey Rose, female   DOB: [birth year], 25 y.o.   MRN: [id number]  S: feeling some contractions O: Vitals:   10/28/20 0601 10/28/20 0703 10/28/20 0730 10/28/20 0753  BP: (!) 132/94 139/85 (!) 148/88   Pulse: (!) 104 86 77   Resp: 16 18  18   Temp:    98.3 F (36.8 C)  TempSrc:    Oral  Weight:      Height:       FHR 140-150, reactive Cvx 2/50/-2 Toco irritability  Transcervical foley catheter placed and balloon inflated w/ 60 cc  A/P 1) Allow pt to eat then start pit 1x1 while foley in 2) FWB reassuring

## 2020-10-29 ENCOUNTER — Encounter (HOSPITAL_COMMUNITY): Payer: Self-pay | Admitting: Obstetrics and Gynecology

## 2020-10-29 LAB — CBC
HCT: 33.4 % — ABNORMAL LOW (ref 36.0–46.0)
Hemoglobin: 11.2 g/dL — ABNORMAL LOW (ref 12.0–15.0)
MCH: 31 pg (ref 26.0–34.0)
MCHC: 33.5 g/dL (ref 30.0–36.0)
MCV: 92.5 fL (ref 80.0–100.0)
Platelets: 174 10*3/uL (ref 150–400)
RBC: 3.61 MIL/uL — ABNORMAL LOW (ref 3.87–5.11)
RDW: 14.9 % (ref 11.5–15.5)
WBC: 16.5 10*3/uL — ABNORMAL HIGH (ref 4.0–10.5)
nRBC: 0 % (ref 0.0–0.2)

## 2020-10-29 MED ORDER — SENNOSIDES-DOCUSATE SODIUM 8.6-50 MG PO TABS
2.0000 | ORAL_TABLET | Freq: Every day | ORAL | Status: DC
  Administered 2020-10-30 – 2020-10-31 (×2): 2 via ORAL
  Filled 2020-10-29 (×2): qty 2

## 2020-10-29 MED ORDER — ONDANSETRON HCL 4 MG PO TABS: 4.0000 mg | ORAL_TABLET | ORAL | Status: DC | PRN

## 2020-10-29 MED ORDER — SODIUM CHLORIDE 0.9 % IV SOLN: 250.0000 mL | INTRAVENOUS | Status: DC | PRN

## 2020-10-29 MED ORDER — SIMETHICONE 80 MG PO CHEW: 80.0000 mg | CHEWABLE_TABLET | ORAL | Status: DC | PRN

## 2020-10-29 MED ORDER — WITCH HAZEL-GLYCERIN EX PADS: 1.0000 "application " | MEDICATED_PAD | CUTANEOUS | Status: DC | PRN

## 2020-10-29 MED ORDER — PRENATAL MULTIVITAMIN CH
1.0000 | ORAL_TABLET | Freq: Every day | ORAL | Status: DC
  Administered 2020-10-29 – 2020-10-31 (×3): 1 via ORAL
  Filled 2020-10-29 (×3): qty 1

## 2020-10-29 MED ORDER — COCONUT OIL OIL: 1.0000 "application " | TOPICAL_OIL | Status: DC | PRN

## 2020-10-29 MED ORDER — ZOLPIDEM TARTRATE 5 MG PO TABS: 5.0000 mg | ORAL_TABLET | Freq: Every evening | ORAL | Status: DC | PRN

## 2020-10-29 MED ORDER — SODIUM CHLORIDE 0.9% FLUSH: 3.0000 mL | Freq: Two times a day (BID) | INTRAVENOUS | Status: DC

## 2020-10-29 MED ORDER — OXYCODONE-ACETAMINOPHEN 5-325 MG PO TABS: 2.0000 | ORAL_TABLET | ORAL | Status: DC | PRN

## 2020-10-29 MED ORDER — IBUPROFEN 800 MG PO TABS
800.0000 mg | ORAL_TABLET | Freq: Three times a day (TID) | ORAL | Status: DC
  Administered 2020-10-29 – 2020-10-31 (×8): 800 mg via ORAL
  Filled 2020-10-29 (×8): qty 1

## 2020-10-29 MED ORDER — ACETAMINOPHEN 325 MG PO TABS: 650.0000 mg | ORAL_TABLET | ORAL | Status: DC | PRN

## 2020-10-29 MED ORDER — METHYLERGONOVINE MALEATE 0.2 MG/ML IJ SOLN: 0.2000 mg | INTRAMUSCULAR | Status: DC | PRN

## 2020-10-29 MED ORDER — MAGNESIUM HYDROXIDE 400 MG/5ML PO SUSP: 30.0000 mL | ORAL | Status: DC | PRN

## 2020-10-29 MED ORDER — DIBUCAINE (PERIANAL) 1 % EX OINT: 1.0000 "application " | TOPICAL_OINTMENT | CUTANEOUS | Status: DC | PRN

## 2020-10-29 MED ORDER — METHYLERGONOVINE MALEATE 0.2 MG PO TABS: 0.2000 mg | ORAL_TABLET | ORAL | Status: DC | PRN

## 2020-10-29 MED ORDER — RHO D IMMUNE GLOBULIN 1500 UNIT/2ML IJ SOSY
300.0000 ug | PREFILLED_SYRINGE | Freq: Once | INTRAMUSCULAR | Status: AC
  Administered 2020-10-29: 300 ug via INTRAVENOUS
  Filled 2020-10-29: qty 2

## 2020-10-29 MED ORDER — ONDANSETRON HCL 4 MG/2ML IJ SOLN: 4.0000 mg | INTRAMUSCULAR | Status: DC | PRN

## 2020-10-29 MED ORDER — FERROUS SULFATE 325 (65 FE) MG PO TABS
325.0000 mg | ORAL_TABLET | Freq: Two times a day (BID) | ORAL | Status: DC
  Administered 2020-10-29 – 2020-10-31 (×5): 325 mg via ORAL
  Filled 2020-10-29 (×5): qty 1

## 2020-10-29 MED ORDER — SODIUM CHLORIDE 0.9% FLUSH: 3.0000 mL | INTRAVENOUS | Status: DC | PRN

## 2020-10-29 MED ORDER — BENZOCAINE-MENTHOL 20-0.5 % EX AERO
1.0000 "application " | INHALATION_SPRAY | CUTANEOUS | Status: DC | PRN
  Administered 2020-10-29: 1 via TOPICAL
  Filled 2020-10-29: qty 56

## 2020-10-29 MED ORDER — ERYTHROMYCIN 5 MG/GM OP OINT
TOPICAL_OINTMENT | OPHTHALMIC | Status: AC
  Filled 2020-10-29: qty 1

## 2020-10-29 MED ORDER — MEASLES, MUMPS & RUBELLA VAC IJ SOLR: 0.5000 mL | Freq: Once | INTRAMUSCULAR | Status: DC

## 2020-10-29 MED ORDER — TETANUS-DIPHTH-ACELL PERTUSSIS 5-2.5-18.5 LF-MCG/0.5 IM SUSY
0.5000 mL | PREFILLED_SYRINGE | Freq: Once | INTRAMUSCULAR | Status: AC
  Administered 2020-10-31: 0.5 mL via INTRAMUSCULAR
  Filled 2020-10-29: qty 0.5

## 2020-10-29 MED ORDER — DIPHENHYDRAMINE HCL 25 MG PO CAPS: 25.0000 mg | ORAL_CAPSULE | Freq: Four times a day (QID) | ORAL | Status: DC | PRN

## 2020-10-29 NOTE — Lactation Note (Signed)
This note was copied from a baby's chart. Lactation Consultation Note  Patient Name: Casey Rose [id number] Date: 10/29/2020 Reason for consult: Initial assessment;Primapara;1st time breastfeeding;Early term 37-38.6wks Age:23 hours 1st LC visit in Room 402 - + Covid - Isolation precautions maintained.  Baby STS with mom and rooting , LC offered to assist/ and Latched on the left breast / football / with increased swallows with moist warm compress on moms breast for 17 mins , baby fell asleep. Latch Score 8 / per mom comfortable.  LC plan :  LC reviewed breast feeding basics.  Feeding goals - feed with feeding cues / 8-12 feedings per day.  Due to baby being Early term . Recommended to mom if baby not showing feeding cues by 3 hours / have dad check diaper / change if needed and place STS.  Call for RN or [facility] for latch assistance.    Maternal Data - Per mom + breast changes with pregnancy.    Feeding Mother's Current Feeding Choice: Breast Milk  LATCH Score Latch: Grasps breast easily, tongue down, lips flanged, rhythmical sucking.  Audible Swallowing: A few with stimulation  Type of Nipple: Everted at rest and after stimulation  Comfort (Breast/Nipple): Soft / non-tender  Hold (Positioning): Assistance needed to correctly position infant at breast and maintain latch.  LATCH Score: 8   Lactation Tools Discussed/Used    Interventions Interventions: Breast feeding basics reviewed;Skin to skin;Assisted with latch;Breast massage;Hand express;Reverse pressure;Breast compression;Adjust position;Support pillows;Position options;Education  Discharge [facility] Program: No (per mom has appt 6/21)  Consult Status Consult Status: Follow-up Date: 10/30/20 Follow-up type: In-patient    Matilde Sprang Fenna Semel 10/29/2020, 9:21 AM

## 2020-10-29 NOTE — Plan of Care (Signed)
  Problem: Education: Goal: Knowledge of Childbirth will improve Outcome: Adequate for Discharge Goal: Ability to make informed decisions regarding treatment and plan of care will improve Outcome: Adequate for Discharge Goal: Ability to state and carry out methods to decrease the pain will improve Outcome: Adequate for Discharge Goal: Individualized Educational Video(s) Outcome: Adequate for Discharge   Problem: Coping: Goal: Ability to verbalize concerns and feelings about labor and delivery will improve Outcome: Adequate for Discharge   Problem: Life Cycle: Goal: Ability to make normal progression through stages of labor will improve Outcome: Adequate for Discharge Goal: Ability to effectively push during vaginal delivery will improve Outcome: Adequate for Discharge

## 2020-10-29 NOTE — Anesthesia Postprocedure Evaluation (Signed)
Anesthesia Post Note  Patient: Casey Rose  Procedure(s) Performed: AN AD HOC LABOR EPIDURAL     Patient location during evaluation: Mother Baby Anesthesia Type: Epidural Level of consciousness: awake Pain management: satisfactory to patient Vital Signs Assessment: post-procedure vital signs reviewed and stable Respiratory status: spontaneous breathing Cardiovascular status: stable Anesthetic complications: no   No notable events documented.  Last Vitals:  Vitals:   10/29/20 1016 10/29/20 1416  BP: 127/67 117/61  Pulse: 92 94  Resp: 20 18  Temp: 37.1 C 36.9 C  SpO2:      Last Pain:  Vitals:   10/29/20 1416  TempSrc:   PainSc: 0-No pain   Pain Goal:                   [employer]

## 2020-10-29 NOTE — Progress Notes (Signed)
Patient is eating, ambulating, voiding.  Pain control is good.  Vitals:   10/29/20 0230 10/29/20 0300 10/29/20 0335 10/29/20 0410  BP: 94/75 121/76 140/85 (!) 146/92  Pulse: (!) 116 94 91 92  Resp:   18 18  Temp:   99.4 F (37.4 C) 98.6 F (37 C)  TempSrc:   Oral Oral  SpO2:   100% 100%  Weight:      Height:        Fundus firm Perineum without swelling.  Lab Results  Component Value Date   WBC 11.1 (H) 10/28/2020   HGB 12.7 10/28/2020   HCT 38.4 10/28/2020   MCV 93.7 10/28/2020   PLT 202 10/28/2020    --/--/O NEG (06/09 1849)/RI  A/P Post partum day 0. Routine care.  Expect d/c two days.  BAby is Rh Pos- Pt needs Rhogam. Pt with GHTN and not severe range BPs.  Covid POS- assympt.  Cord ph was not reported.  Casey Rose

## 2020-10-30 ENCOUNTER — Encounter (HOSPITAL_COMMUNITY): Payer: Self-pay | Admitting: Obstetrics and Gynecology

## 2020-10-30 LAB — RH IG WORKUP (INCLUDES ABO/RH)
Fetal Screen: NEGATIVE
Gestational Age(Wks): 37
Unit division: 0

## 2020-10-30 NOTE — Progress Notes (Addendum)
Patient is eating, ambulating, voiding.  Pain control is good.  Vitals:   10/29/20 1416 10/29/20 1810 10/29/20 2125 10/30/20 0632  BP: 117/61 128/72 (!) 137/94 132/75  Pulse: 94 99 97 92  Resp: 18 20 18 18   Temp: 98.4 F (36.9 C) 99 F (37.2 C) 98.2 F (36.8 C) 98 F (36.7 C)  TempSrc:   Oral Oral  SpO2:   100% 98%  Weight:      Height:        Fundus firm Perineum without swelling.  Lab Results  Component Value Date   WBC 16.5 (H) 10/29/2020   HGB 11.2 (L) 10/29/2020   HCT 33.4 (L) 10/29/2020   MCV 92.5 10/29/2020   PLT 174 10/29/2020    --/--/O NEG (06/09 1849)/RI  A/P Post partum day 1 inducttion for GHTN  Routine care.  Expect d/c tomorrow- pt not candidate for d/c today- need to watch BPs.  Covid + but assymptomatic.  Rhogam given.  11-12-1996

## 2020-10-30 NOTE — Lactation Note (Signed)
This note was copied from a baby's chart. Lactation Consultation Note  Patient Name: Casey Rose [id number] Date: 10/30/2020 Reason for consult: Follow-up assessment Age:23 hours  LC in to room for follow up of 41 hours old infant. Infant is breastfeeding and LC noticed sub-optimal position and shallow latch. Offered assistance, reviewed importance of a deep latch for nipple comfort and effective milk transfer. Deep latch achieved, active suckling, breast tissue movement and audible swallowing noted. Mother denied nipple discomfort or pain. Reviewed back and neck support to avoid baby slipping off breast. Discussed supplementation and paced bottle feeding. Parents report infant has not have any formula since last night.  Feeding plan:  1-Skin to skin 2-Aim for a deep, comfortable latch 3-Breastfeeding on demand or 8-12 times in 24h period. 4-Keep infant awake during breastfeeding session: massaging breast, infant's hand/shoulder/feet 5-Pump or hand-express and offer EBM prior to supplementation. 6-If needed, supplement following guidelines, paced bottle feeding and fullness cues.  7-Monitor voids and stools as signs good intake.  8-Encouraged maternal rest, hydration and food intake.  9-Contact LC as needed for feeds/support/concerns/questions   All questions answered at this time. Baby still breastfeeding upon departure.   Feeding Mother's Current Feeding Choice: Breast Milk and Formula  LATCH Score Latch: Grasps breast easily, tongue down, lips flanged, rhythmical sucking.  Audible Swallowing: Spontaneous and intermittent  Type of Nipple: Everted at rest and after stimulation  Comfort (Breast/Nipple): Filling, red/small blisters or bruises, mild/mod discomfort  Hold (Positioning): Assistance needed to correctly position infant at breast and maintain latch.  LATCH Score: 8   Interventions Interventions: Breast feeding basics reviewed;Assisted with latch;Breast  compression;Adjust position;Expressed milk;Education  Consult Status Consult Status: Follow-up Date: 10/31/20 Follow-up type: In-patient    Casey Rose 10/30/2020, 6:27 PM

## 2020-10-31 DIAGNOSIS — U071 COVID-19: Secondary | ICD-10-CM | POA: Diagnosis present

## 2020-10-31 MED ORDER — NIFEDIPINE ER OSMOTIC RELEASE 30 MG PO TB24
30.0000 mg | ORAL_TABLET | Freq: Every day | ORAL | Status: DC
  Administered 2020-10-31: 30 mg via ORAL
  Filled 2020-10-31: qty 1

## 2020-10-31 MED ORDER — SENNOSIDES-DOCUSATE SODIUM 8.6-50 MG PO TABS: 2.0000 | ORAL_TABLET | Freq: Every day | ORAL | 1 refills | Status: AC

## 2020-10-31 MED ORDER — ACETAMINOPHEN 325 MG PO TABS: 650.0000 mg | ORAL_TABLET | ORAL | 1 refills | Status: AC | PRN

## 2020-10-31 MED ORDER — NIFEDIPINE ER 30 MG PO TB24: 30.0000 mg | ORAL_TABLET | Freq: Every day | ORAL | 1 refills | Status: AC

## 2020-10-31 MED ORDER — IBUPROFEN 600 MG PO TABS: 600.0000 mg | ORAL_TABLET | Freq: Four times a day (QID) | ORAL | 1 refills | Status: AC | PRN

## 2020-10-31 NOTE — Discharge Summary (Signed)
Postpartum Discharge Summary   Patient Name: Casey Rose DOB: [birth year] MRN: [id number]  Date of admission: 10/27/2020 Delivery date:10/29/2020  Delivering provider: Bobbye Charleston  Date of discharge: 11/01/2020  Admitting diagnosis: Pregnancy induced hypertension [O13.9] Intrauterine pregnancy: [redacted]w[redacted]d    Secondary diagnosis:  Active Problems:   Pregnancy induced hypertension   COVID-19  Additional problems: none   Discharge diagnosis: Term Pregnancy Delivered, Gestational Hypertension, and Anemia                                              Post partum procedures:rhogam Augmentation: AROM, Pitocin, Cytotec, and IP Foley Complications: None  Hospital course: Induction of Labor With Vaginal Delivery   25y.o. yo G1P1001 at 358w6das admitted to the hospital 10/27/2020 for induction of labor.  Indication for induction: Gestational hypertension.  Patient had an uncomplicated labor course as follows: Membrane Rupture Time/Date: 3:28 PM ,10/28/2020   Delivery Method:Vaginal, Vacuum (Extractor)  Episiotomy: Median  Lacerations:    Details of delivery can be found in separate delivery note.  For GHTN she was started on Procardia 3057mL PPD#2, BP were 130-140s/70-80s. She was asymptomatic for COVID. Patient otherwise had a routine postpartum course. Patient is discharged home 11/01/20.  Newborn Data: Birth date:10/29/2020  Birth time:1:18 AM  Gender:Female  Living status:Living  Apgars:2 ,8  Weight:3055 g   Magnesium Sulfate received: No BMZ received: No Rhophylac:Yes MMR:No T-DaP:Given postpartum Flu: declined prenatal Transfusion:No  Physical exam  Vitals:   10/31/20 0922 10/31/20 1210 10/31/20 1552 10/31/20 1820  BP: 131/83 135/76 (!) 145/89 136/75  Pulse: 96 85 94   Resp:   18   Temp: 99.1 F (37.3 C)  98.4 F (36.9 C)   TempSrc: Oral  Oral   SpO2: 100%  100%   Weight:      Height:       General: alert Lochia: appropriate Uterine Fundus: firm DVT  Evaluation: No evidence of DVT seen on physical exam. Labs: Lab Results  Component Value Date   WBC 16.5 (H) 10/29/2020   HGB 11.2 (L) 10/29/2020   HCT 33.4 (L) 10/29/2020   MCV 92.5 10/29/2020   PLT 174 10/29/2020   CMP Latest Ref Rng & Units 10/27/2020  Glucose 70 - 99 mg/dL 91  BUN 6 - 20 mg/dL 7  Creatinine 0.44 - 1.00 mg/dL 0.74  Sodium 135 - 145 mmol/L 136  Potassium 3.5 - 5.1 mmol/L 4.2  Chloride 98 - 111 mmol/L 105  CO2 22 - 32 mmol/L 20(L)  Calcium 8.9 - 10.3 mg/dL 9.4  Total Protein 6.5 - 8.1 g/dL 6.6  Total Bilirubin 0.3 - 1.2 mg/dL 0.8  Alkaline Phos 38 - 126 U/L 99  AST 15 - 41 U/L 35  ALT 0 - 44 U/L 23   Edinburgh Score: Edinburgh Postnatal Depression Scale Screening Tool 10/31/2020  I have been able to laugh and see the funny side of things. 0  I have looked forward with enjoyment to things. 0  I have blamed myself unnecessarily when things went wrong. 1  I have been anxious or worried for no good reason. 0  I have felt scared or panicky for no good reason. 1  Things have been getting on top of me. 1  I have been so unhappy that I have had difficulty sleeping. 0  I have felt sad or  miserable. 0  I have been so unhappy that I have been crying. 0  The thought of harming myself has occurred to me. 0  Edinburgh Postnatal Depression Scale Total 3      After visit meds:  Allergies as of 10/31/2020   No Known Allergies      Medication List     STOP taking these medications    aspirin EC 81 MG tablet       TAKE these medications    acetaminophen 325 MG tablet Commonly known as: Tylenol Take 2 tablets (650 mg total) by mouth every 4 (four) hours as needed (for pain scale < 4).   ibuprofen 600 MG tablet Commonly known as: ADVIL Take 1 tablet (600 mg total) by mouth every 6 (six) hours as needed.   NIFEdipine 30 MG 24 hr tablet Commonly known as: ADALAT CC Take 1 tablet (30 mg total) by mouth daily.   prenatal multivitamin Tabs tablet Take 1  tablet by mouth daily at 12 noon.   senna-docusate 8.6-50 MG tablet Commonly known as: Senokot-S Take 2 tablets by mouth daily.   valACYclovir 500 MG tablet Commonly known as: VALTREX Take 500 mg by mouth 2 (two) times daily.               Discharge Care Instructions  (From admission, onward)           Start     Ordered   10/30/20 0000  Discharge wound care:       Comments: Sitz baths and icepacks to perineum.  If stitches, they will dissolve.   10/30/20 0937             Discharge home in stable condition Infant Feeding: Bottle and Breast Infant Disposition:home with mother Discharge instruction: per After Visit Summary and Postpartum booklet. Activity: Advance as tolerated. Pelvic rest for 6 weeks.  Diet: routine diet Anticipated Birth Control: Depo vs. COCs Postpartum Appointment:4 weeks Additional Postpartum F/U: BP check 1 week Future Appointments:No future appointments. Follow up Visit:  Follow-up Information     Bobbye Charleston, MD Follow up in 4 week(s).   Specialty: Obstetrics and Gynecology Contact information: [place] [address] [phone]         Jonelle Sidle, MD. Schedule an appointment as soon as possible for a visit.   Specialty: Obstetrics and Gynecology Why: Call to schedule BP check week of 7/20 Contact information: [place] [address] [phone]                     11/01/2020 Jonelle Sidle, MD

## 2020-10-31 NOTE — Discharge Instructions (Addendum)
For COVID-19:  Isolation until June 15th (mask around others in your home), you can then wear a well fitting mask around other people through June 19th. If you remain asymptomatic then you are done quarantining on June 20th.  OK to get COVID vaccine anytime after June 15th, recommend you receive both vaccines prior to September 9th (3 months after your positive test).   Check your blood pressure two times a day. If >140/90 (either number) let [facility] know. If >160/110 (either number), call [facility] or be evaluated at [facility] or nearest Emergency department.

## 2020-10-31 NOTE — Progress Notes (Addendum)
Post Partum Day 2 Subjective: no complaints, up ad lib, voiding, tolerating PO, and denies HA, VC, RUQ pain, SOB. Desires discharge today  Objective: Patient Vitals for the past 24 hrs:  BP Temp Temp src Pulse Resp SpO2  10/31/20 0922 131/83 99.1 F (37.3 C) Oral 96 -- 100 %  10/31/20 0630 135/88 98.4 F (36.9 C) Oral 87 18 --  10/30/20 1945 114/69 98.8 F (37.1 C) Oral (!) 102 18 100 %  10/30/20 1546 (!) 100/54 98.8 F (37.1 C) -- -- -- --    Physical Exam:  General: alert and no distress Lochia: appropriate Uterine Fundus: firm DVT Evaluation: No evidence of DVT seen on physical exam.  Recent Labs    10/28/20 1152 10/29/20 0508  WBC 11.1* 16.5*  HGB 12.7 11.2*  HCT 38.4 33.4*  PLT 202 174   Assessment/Plan: Planning for DC this afternoon pending blood pressures Lizette Dubree 25 y.o. G1P1001 PPD#2 sp VAVD, shoulder dystocia at [redacted]w[redacted]d IOL for GHTN 1. PPC: 2nd degree episiotomy repaired. EBL 400cc, Hgb 12.7>11.2, discussed PO iron at discharge 2. GHTN: elevated BP this AM (130s/80s), yesterday normotensive, will start procardia 30mg  xL. Discussed checking BP BID at home, reviewed precautions, plan for BP check in the office next week 3. COVID 19 positive in December and on admission, reviewed isolation recs with patient and partner, including masking around others and baby. CDC recs - Isostation until 6/15, masking through 6/19. Recommend COVID vaccine, per CDC recs after isoslation period if remains asymptomatic.  4. Rh negative, baby Rh positive. Rhogam given.  Rubella Immune, breast/formula feeding, baby girl in room, birth control considering COCs or Depo.  5. Vaccines: tdap has not received, accepted inpatient, flu has not received   LOS: 4 days   Olevia Westervelt K Taam-Akelman 10/31/2020, 9:39 AM

## 2020-10-31 NOTE — Lactation Note (Signed)
This note was copied from a baby's chart. Lactation Consultation Note  Patient Name: Casey Rose Date: 10/31/2020 Reason for consult: Follow-up assessment;Mother's request;Primapara;1st time breastfeeding;Early term 37-38.6wks;Infant weight loss;Other (Comment) (HTN) Age:23 hours  Mom being held to check on her blood pressure at 6 pm. Mom had some pain with latching and compression stripe noted. LC assisted getting a deeper latch and ensuring bottom lip flanged outward. Mom stated with changes pain resolved.  Mom does not have electric pump at home. She does not have WIC and is in communication with them to complete the application process.  LC set up manual and went over how to use it. LC assessed flange size of 24 as comfortable fit.  Mom compression stripe but no other signs of trauma. Mom using coconut oil for nipple care. Comfort gels provided to use for soreness. Mom to use them without coconut oil, rinse in between use and discard after 6 days. Infant 3 urine but 1 stool today. Parents are aware supplement with EBM or formula after each feeding via slow flow nipple and paced bottle feeding.   Plan 1. LC reviewed with parents feeding based on cues 8-12x in24 hr period, no more than 3 hrs without an attempt. Mom to offer both breasts and look for signs of milk transfer.  2, Dad to supplement with EBM first separate bottle followed by formula Similac 360 total care 20 cal/oz. Breastfeeding supplementation guide reviewed based on hrs of age since delivery.  3. Mom to use her DEBP while she is here, manual pump for home. Pumping q 3hrs for 15 minutes with electric pump.  All questions answered at the end of the visit.   Maternal Data Has patient been taught Hand Expression?: Yes  Feeding Mother's Current Feeding Choice: Breast Milk and Formula  LATCH Score Latch: Repeated attempts needed to sustain latch, nipple held in mouth throughout feeding, stimulation needed to  elicit sucking reflex.  Audible Swallowing: Spontaneous and intermittent  Type of Nipple: Everted at rest and after stimulation  Comfort (Breast/Nipple): Filling, red/small blisters or bruises, mild/mod discomfort  Hold (Positioning): Assistance needed to correctly position infant at breast and maintain latch.  LATCH Score: 7   Lactation Tools Discussed/Used Tools: Pump;Flanges Flange Size: 24 Breast pump type: Manual Reason for Pumping: increase stimulation Pumping frequency: 10 minutes each breast q 3 hrs. Mom does not have electric pump at home. LC reviewed how to use manual pump and assessed flange size.  Interventions Interventions: Breast feeding basics reviewed;Comfort gels;Breast compression;Assisted with latch;Adjust position;Skin to skin;Support pillows;Breast massage;Position options;Hand pump;DEBP;Hand express;Expressed milk;Education;Coconut oil  Discharge Discharge Education: Engorgement and breast care;Warning signs for feeding baby Pump: Manual WIC Program: No (Mom in process of getting WIC. Mom to call WIC in the morning to check on status of her application)  Consult Status Consult Status: Follow-up Date: 11/01/20 Follow-up type: In-patient    Casey Correa  Rose 10/31/2020, 4:47 PM

## 2020-10-31 NOTE — Progress Notes (Signed)
Called Dr. Claudine Mouton notifying of BP recheck at 1600. Dr. Claudine Mouton ordered to recheck blood pressure in 2 hours and call to notify MD. Lajuana Matte

## 2021-08-09 IMAGING — US US PELVIS COMPLETE WITH TRANSVAGINAL
1 series · 14 of 25 positions shown · non-contrast
Comparison: None

CLINICAL DATA: Irregular periods, lower pelvic pain

EXAM:
TRANSABDOMINAL AND TRANSVAGINAL ULTRASOUND OF PELVIS
TECHNIQUE: Both transabdominal and transvaginal ultrasound examinations of the
pelvis were performed. Transabdominal technique was performed for
global imaging of the pelvis including uterus, ovaries, adnexal
regions, and pelvic cul-de-sac. It was necessary to proceed with
endovaginal exam following the transabdominal exam to visualize the
uterus endometrium ovaries.

[Series 1: us pelvis complete with transvaginal · 0.22mm/px · 14 of 84 slices shown]
[im 1/84]
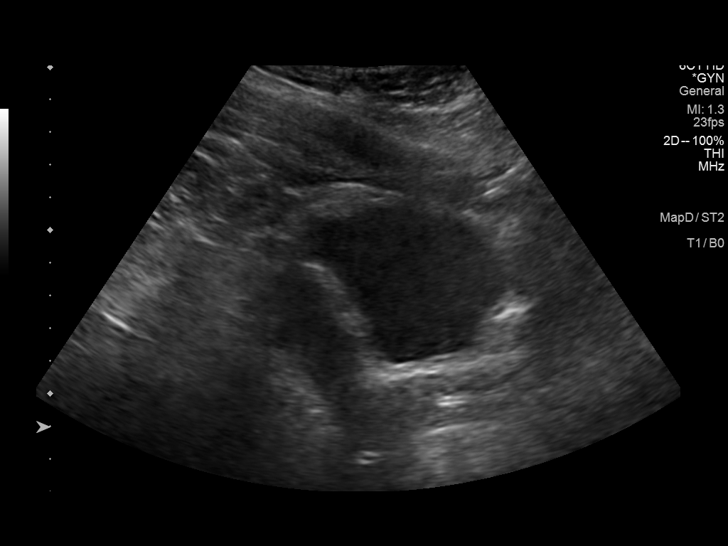
[im 7/84]
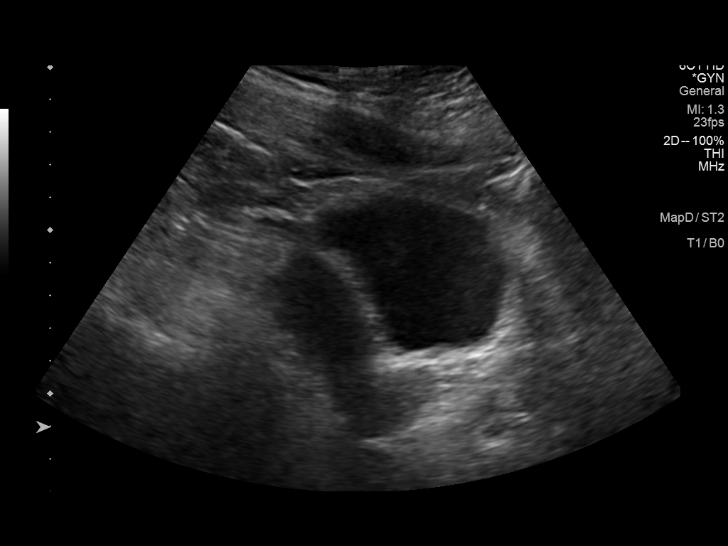
[im 14/84]
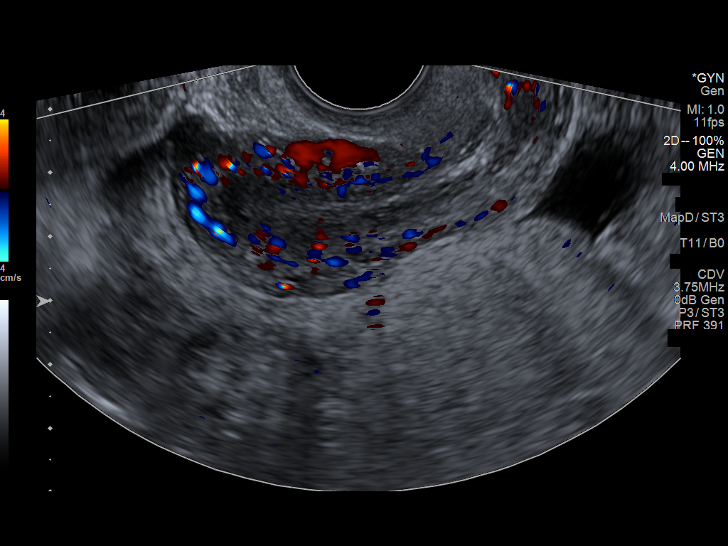
[im 21/84]
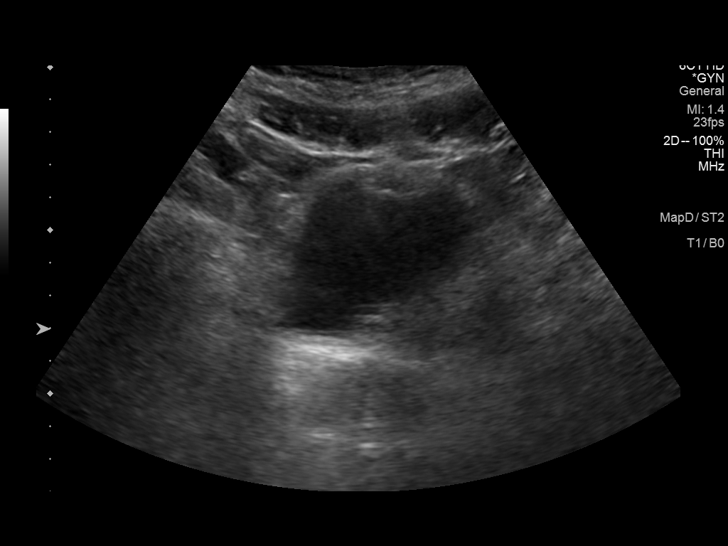
[im 28/84]
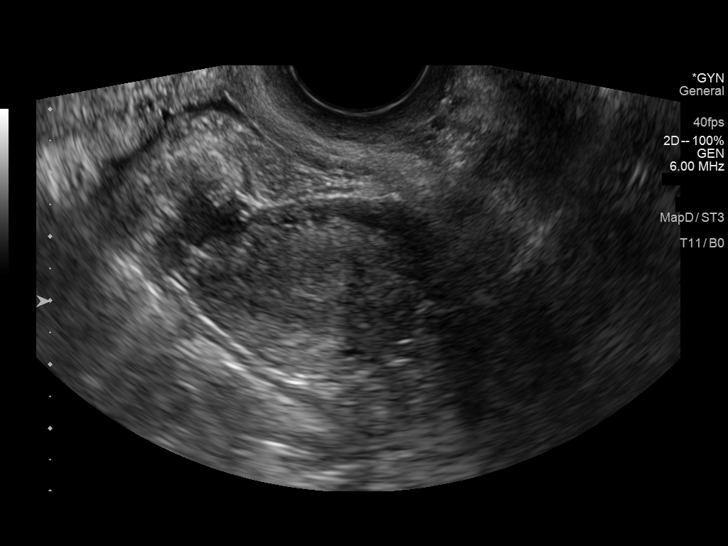
[im 32/84]
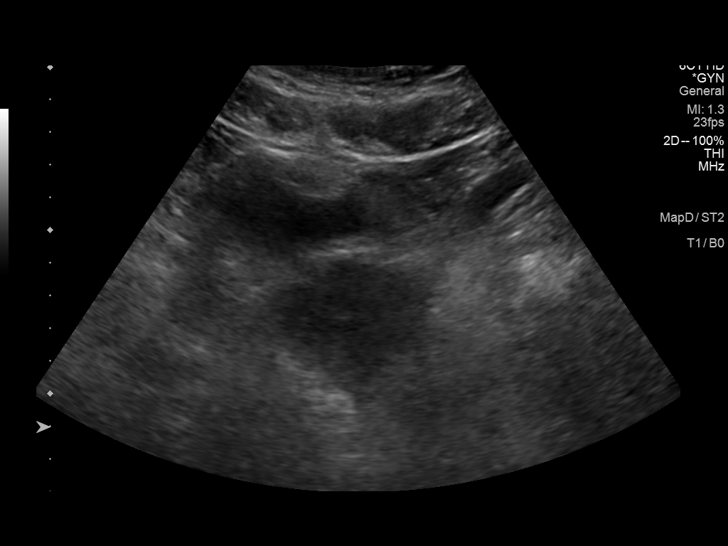
[im 39/84]
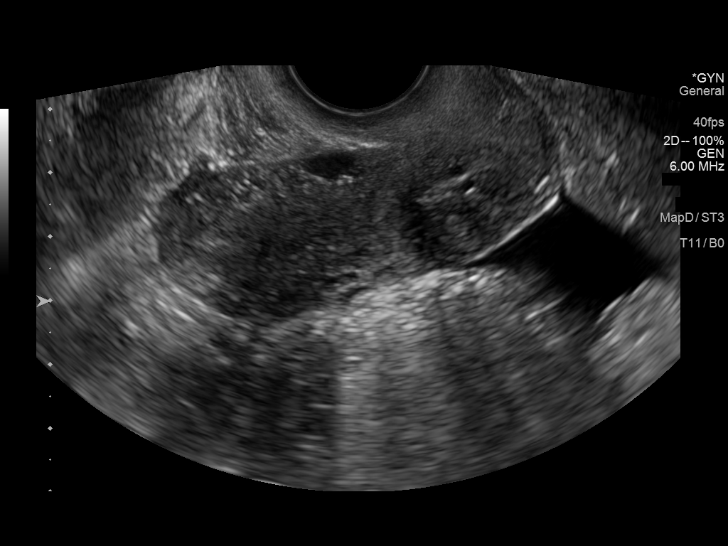
[im 45/84]
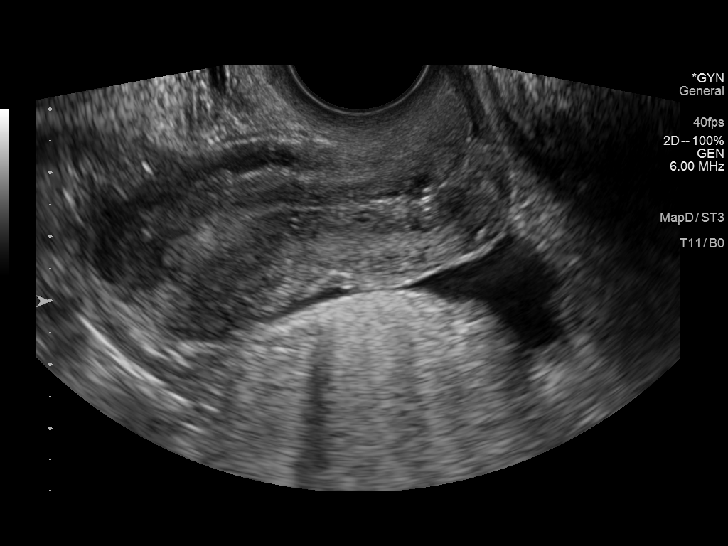
[im 52/84]
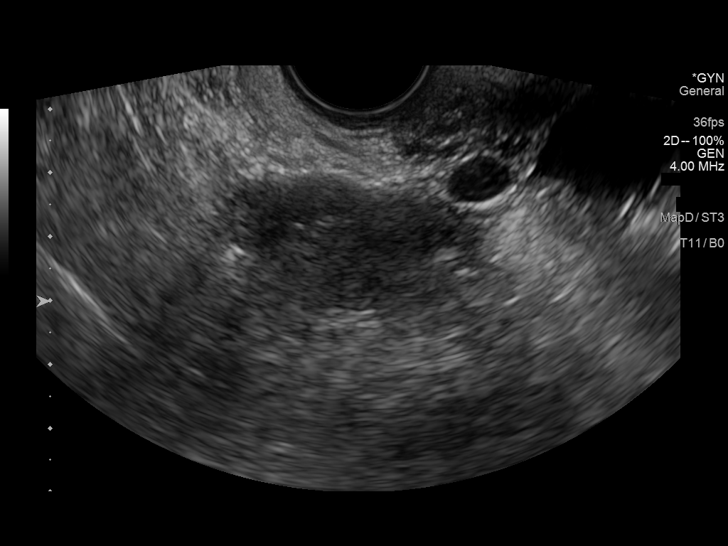
[im 56/84]
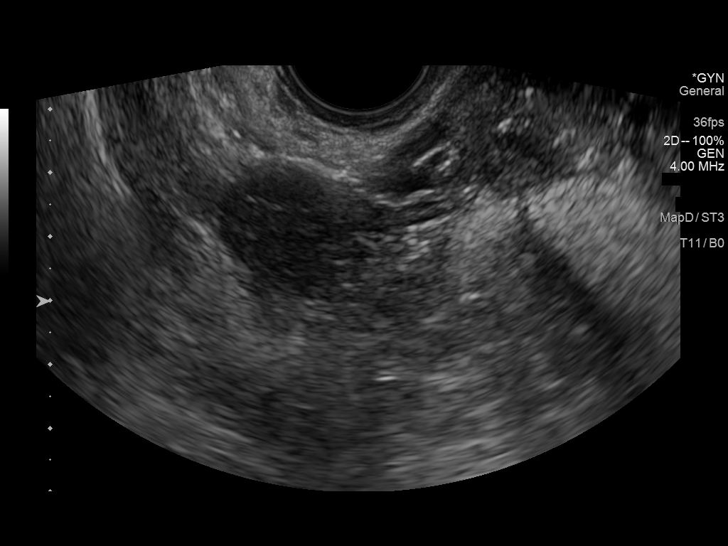
[im 63/84]
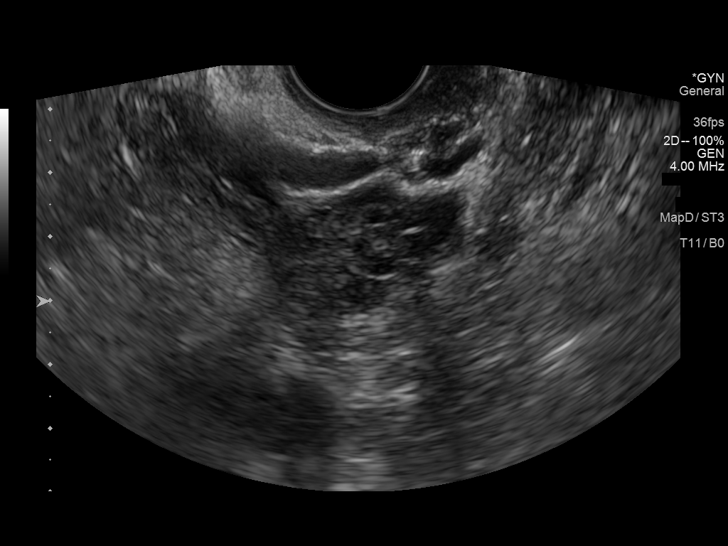
[im 70/84]
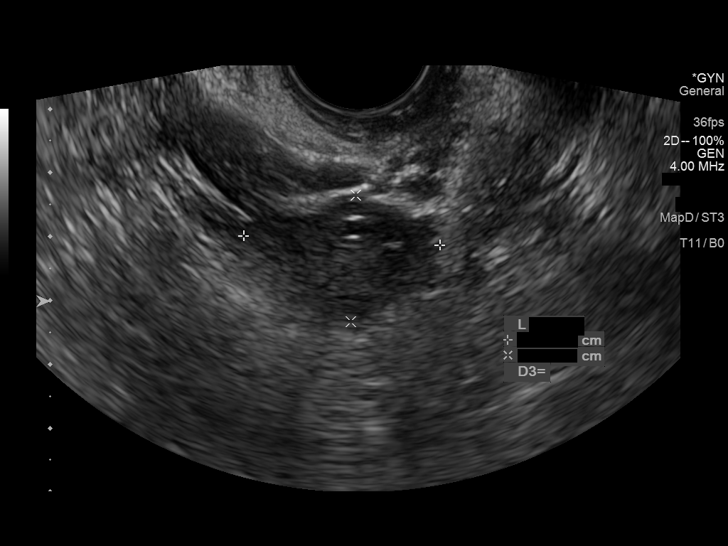
[im 77/84]
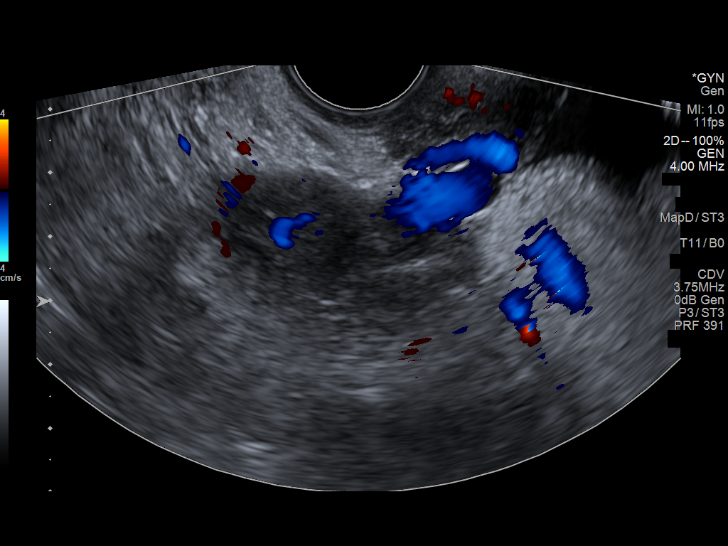
[im 84/84]
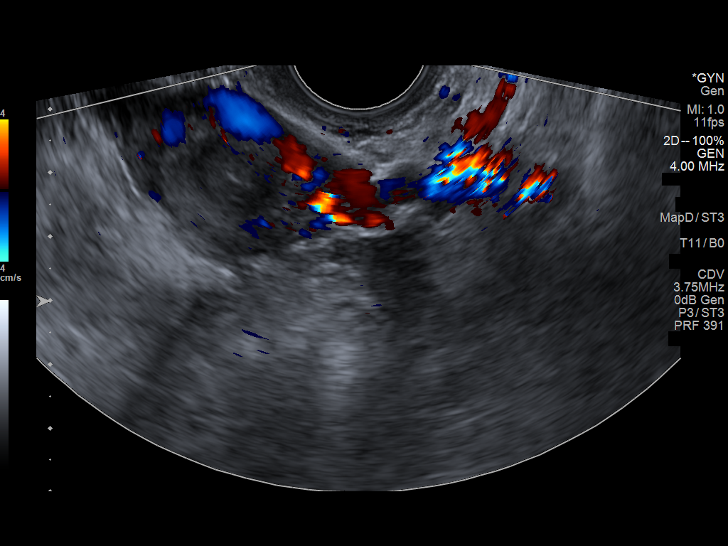

[14 of 25 positions shown; findings below may reference images not displayed]

FINDINGS: Uterus

Measurements: 6.7 x 2.3 x 3.8 cm = volume: 30.1 mL. No fibroids or
other mass visualized. Nabothian cysts in the cervix.

Endometrium

Thickness: 8 mm.  No focal abnormality visualized.

Right ovary

Measurements: 3.5 x 2.4 x 3 cm = volume: 13.2 mL. Normal
appearance/no adnexal mass.

Left ovary

Measurements: 4.1 x 2 x 3.1 cm = volume: 13.1 mL. Normal
appearance/no adnexal mass.

Other findings

Small free fluid in the pelvis.
IMPRESSION: Essentially negative pelvic ultrasound.  Small free fluid.

## 2022-08-13 IMAGING — US US MFM FETAL BPP W/O NON-STRESS
1 series · 12 of 23 positions shown · non-contrast
Comparison: none

[Series 1: us mfm fetal bpp w/o non-stress · 23 acquisitions, 12 frames shown]
[im 1/23]
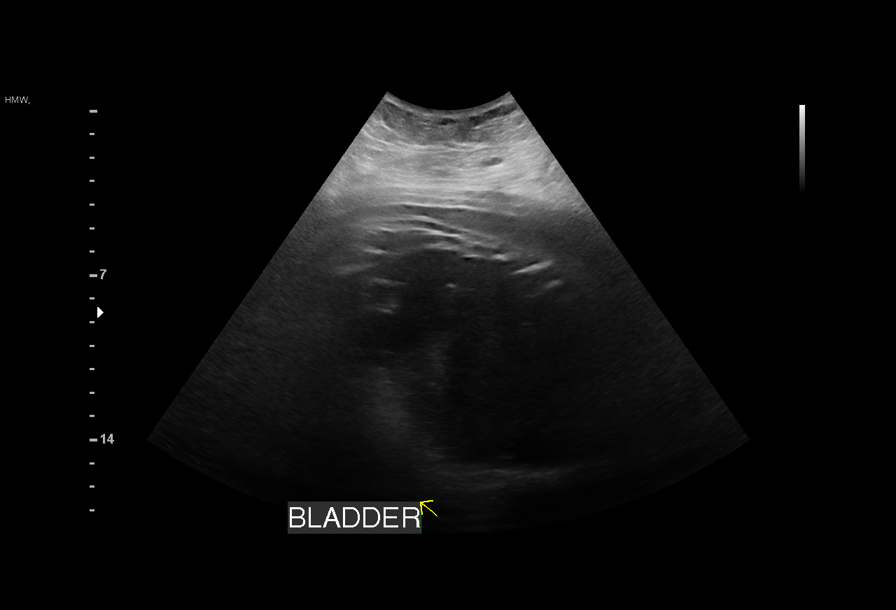
[im 3/23]
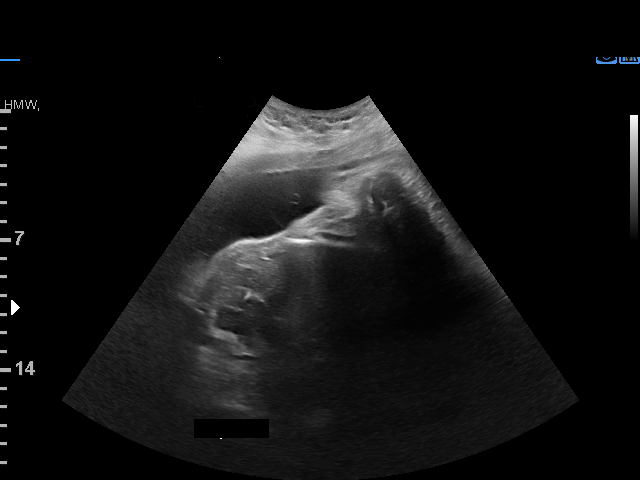
[im 5/23]
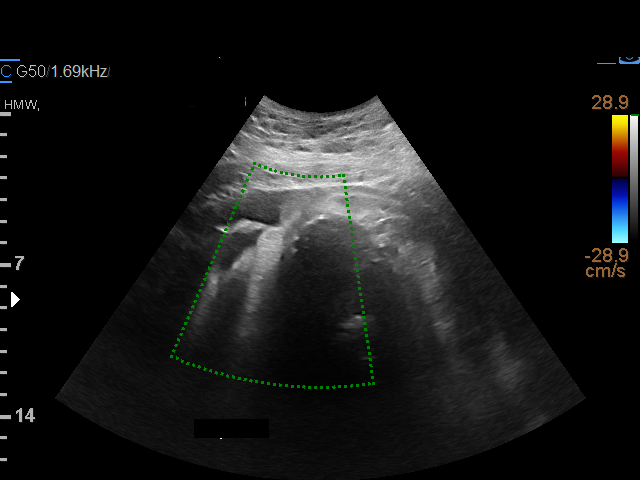
[im 7/23]
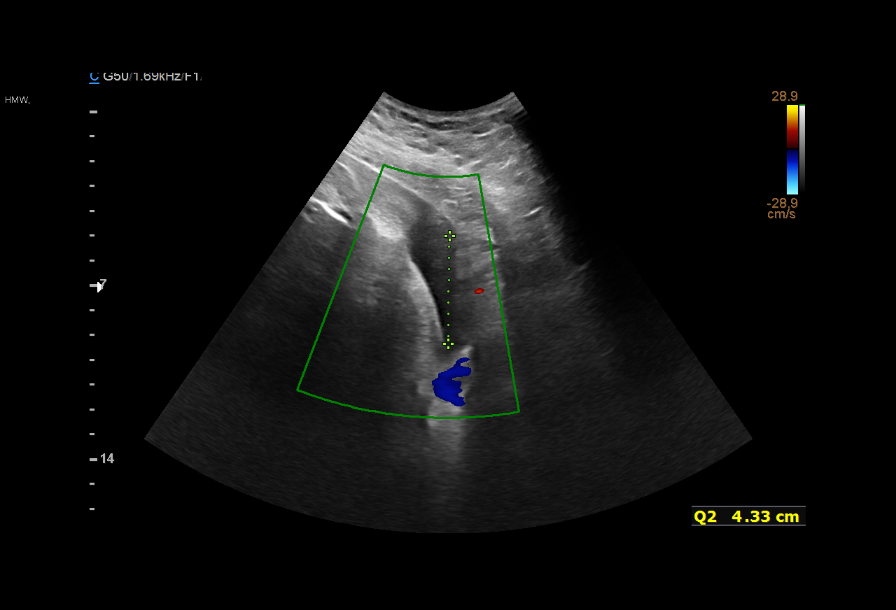
[im 9/23]
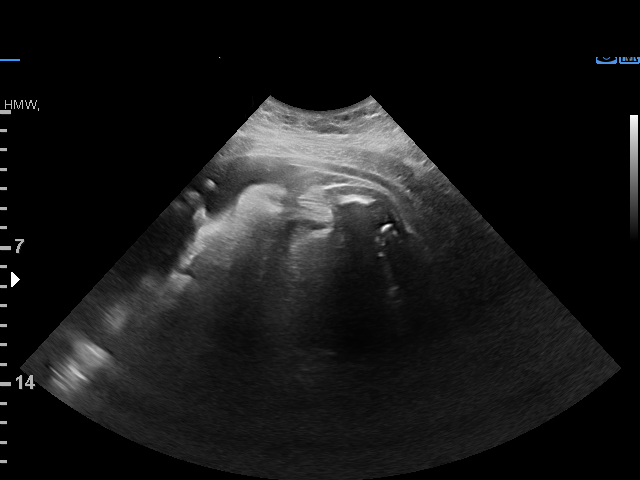
[im 11/23]
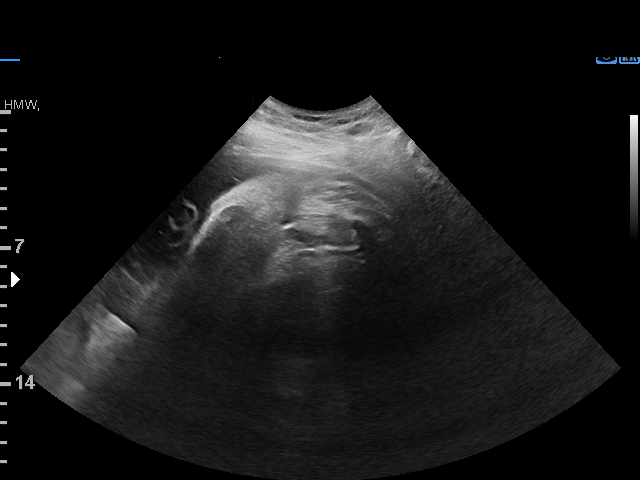
[im 13/23]
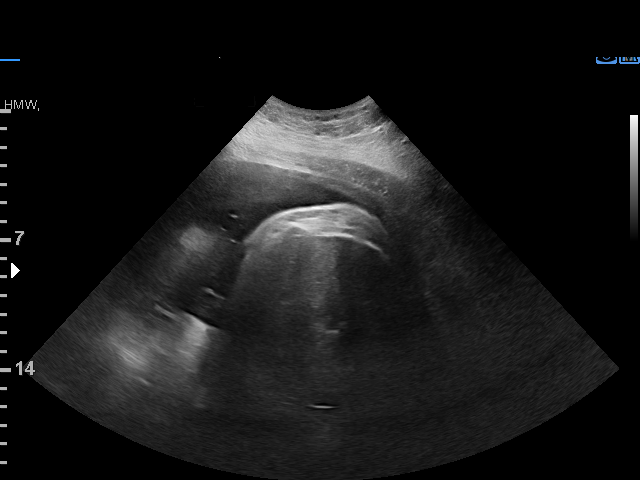
[im 15/23]
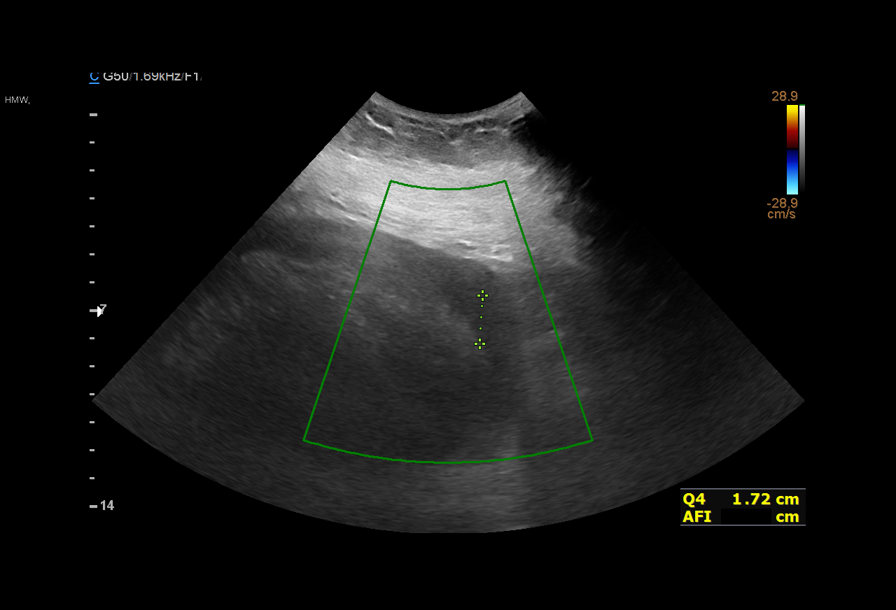
[im 17/23]
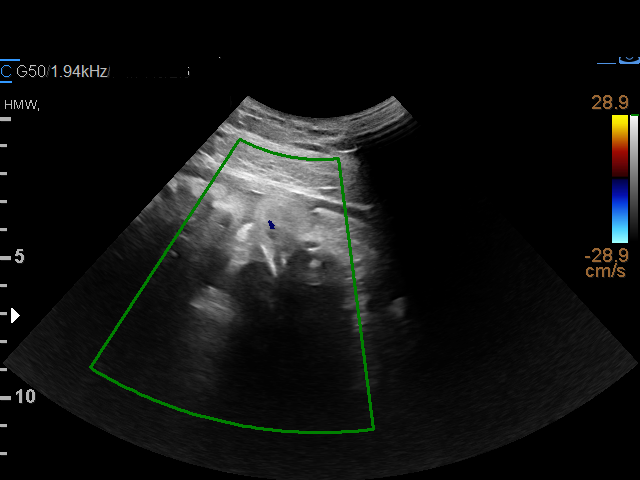
[im 19/23]
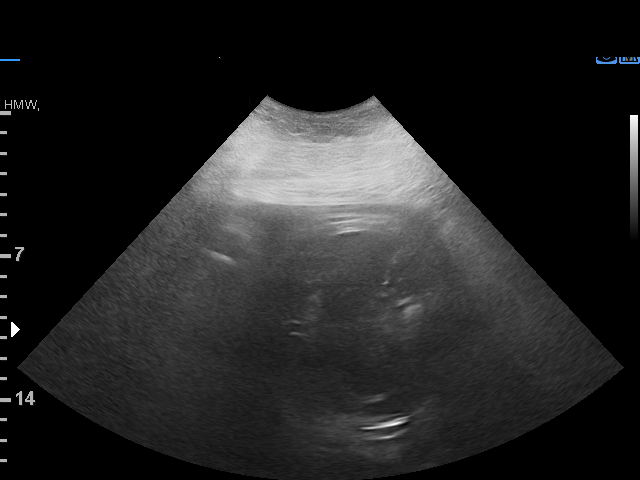
[im 21/23]
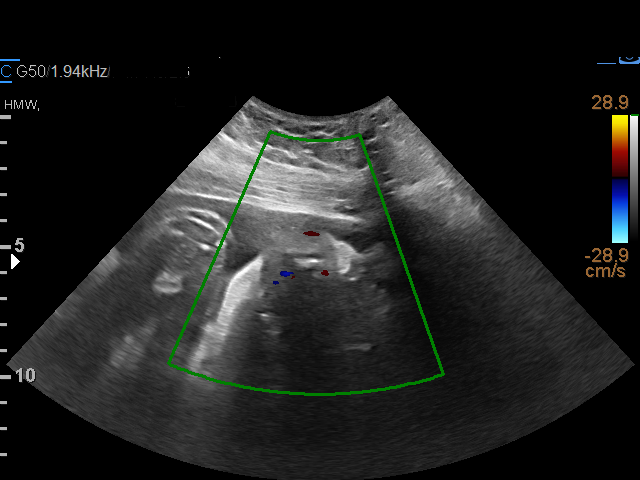
[im 23/23]
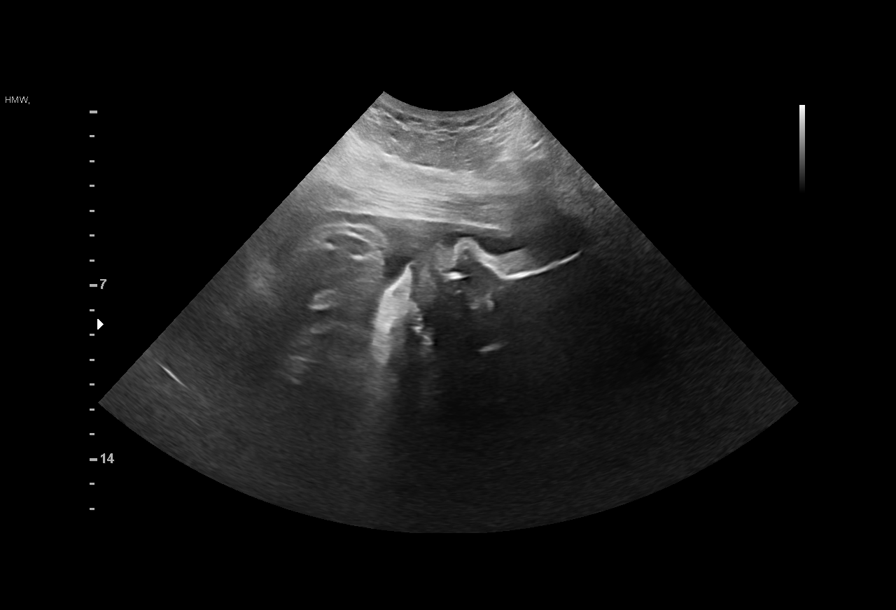

[12 of 23 positions shown; findings below may reference images not displayed]

Addendum:\.br----------------------------------------------------------------------

                                                            OBGYN
                   GARREN

Indications

 Non-reactive NST
 37 weeks gestation of pregnancy
 Obesity complicating pregnancy, second
 trimester (BMI 59)
 Medical complication of pregnancy (Covid-      O[DATE])
 Rh negative state in antepartum
 Tobacco use complicating pregnancy,
 second trimester (Snuff)
 LR NIPT
Fetal Evaluation

 Num Of Fetuses:         1
 Fetal Heart Rate(bpm):  152
 Cardiac Activity:       Observed
 Presentation:           Cephalic
 Placenta:               Posterior Fundal

 Amniotic Fluid
 AFI FV:      Within normal limits

 AFI Sum(cm)     %Tile       Largest Pocket(cm)
 13.8            52

 RUQ(cm)       RLQ(cm)       LUQ(cm)        LLQ(cm)

Biophysical Evaluation
 Amniotic F.V:   Within normal limits       F. Tone:        Observed
 F. Movement:    Observed                   Score:          [DATE]
 F. Breathing:   Observed
OB History

 Blood Type:   O-
 Gravidity:    1
Gestational Age

 LMP:           39w 2d        Date:  01/25/20                 EDD:   10/31/20
 Clinical EDD:  37w 3d                                        EDD:   11/13/20
 Best:          37w 3d     Det. By:  Clinical EDD             EDD:   11/13/20
Anatomy

 Stomach:               Appears normal, left   Bladder:                Appears normal
                        sided
Cervix Uterus Adnexa

 Cervix
 Not visualized (advanced GA >77wks)
Impression

 Antenatal testing performed given NRNST.
 The biophysical profile was [DATE] with good fetal movement and
 amniotic fluid volume.
Recommendations

 Follow up as clinically indicated.

*** End of Addendum ***\.br----------------------------------------------------------------------

                                                             OBGYN
                   GARREN

Indications

 Non-reactive NST
 37 weeks gestation of pregnancy
 Obesity complicating pregnancy, second
 trimester (BMI 59)
 Medical complication of pregnancy (Covid-       O[DATE])
 Rh negative state in antepartum
 Tobacco use complicating pregnancy,
 second trimester (Snuff)
 LR NIPT
Fetal Evaluation

 Num Of Fetuses:          1
 Fetal Heart Rate(bpm):   152
 Cardiac Activity:        Observed
 Presentation:            Cephalic
 Placenta:                Posterior Fundal

 Amniotic Fluid
 AFI FV:      Within normal limits

 AFI Sum(cm)     %Tile       Largest Pocket(cm)
 13.8            52

 RUQ(cm)       RLQ(cm)       LUQ(cm)        LLQ(cm)

Biophysical Evaluation

 Amniotic F.V:   Within normal limits       F. Tone:         Observed
 F. Movement:    Observed                   Score:           [DATE]
 F. Breathing:   Observed
OB History

 Blood Type:   O-
 Gravidity:    1
Gestational Age

 LMP:           39w 2d        Date:  01/25/20                 EDD:   10/31/20
 Clinical EDD:  37w 3d                                        EDD:   11/13/20
 Best:          37w 3d     Det. By:  Clinical EDD             EDD:   11/13/20
Anatomy

 Stomach:               Appears normal, left   Bladder:                Appears normal
                        sided
Cervix Uterus Adnexa

 Cervix
 Not visualized (advanced GA >77wks)
Impression

 Antenatal testing performed given NRNST.
 The biophysical profile was [DATE] with good fetal movement and
 amniotic fluid volume.
Recommendations

 Follow up as clinically indicated.
